# Patient Record
Sex: Female | Born: 1980 | Race: Black or African American | Hispanic: No | Marital: Single | Smoking: Never smoker
Health system: Southern US, Community
[De-identification: ages and names within clinical notes are randomized; demographics above are authoritative.]

## PROBLEM LIST (undated history)

## (undated) DIAGNOSIS — E039 Hypothyroidism, unspecified: Secondary | ICD-10-CM

## (undated) DIAGNOSIS — IMO0002 Reserved for concepts with insufficient information to code with codable children: Secondary | ICD-10-CM

## (undated) DIAGNOSIS — R011 Cardiac murmur, unspecified: Secondary | ICD-10-CM

## (undated) DIAGNOSIS — O139 Gestational [pregnancy-induced] hypertension without significant proteinuria, unspecified trimester: Secondary | ICD-10-CM

## (undated) DIAGNOSIS — J45909 Unspecified asthma, uncomplicated: Secondary | ICD-10-CM

## (undated) DIAGNOSIS — B999 Unspecified infectious disease: Secondary | ICD-10-CM

## (undated) DIAGNOSIS — K219 Gastro-esophageal reflux disease without esophagitis: Secondary | ICD-10-CM

## (undated) DIAGNOSIS — D649 Anemia, unspecified: Secondary | ICD-10-CM

## (undated) HISTORY — DX: Unspecified infectious disease: B99.9

## (undated) HISTORY — DX: Unspecified asthma, uncomplicated: J45.909

## (undated) HISTORY — DX: Gastro-esophageal reflux disease without esophagitis: K21.9

## (undated) HISTORY — DX: Cardiac murmur, unspecified: R01.1

## (undated) HISTORY — DX: Gestational (pregnancy-induced) hypertension without significant proteinuria, unspecified trimester: O13.9

## (undated) HISTORY — DX: Hypothyroidism, unspecified: E03.9

## (undated) HISTORY — DX: Reserved for concepts with insufficient information to code with codable children: IMO0002

## (undated) HISTORY — DX: Anemia, unspecified: D64.9

---
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
## 1998-10-26 DIAGNOSIS — R011 Cardiac murmur, unspecified: Secondary | ICD-10-CM

## 1998-10-26 HISTORY — DX: Cardiac murmur, unspecified: R01.1

## 1999-06-11 ENCOUNTER — Other Ambulatory Visit: Admission: RE | Admit: 1999-06-11 | Discharge: 1999-06-11 | Payer: Self-pay | Admitting: *Deleted

## 2000-01-09 ENCOUNTER — Other Ambulatory Visit: Admission: RE | Admit: 2000-01-09 | Discharge: 2000-01-09 | Payer: Self-pay | Admitting: *Deleted

## 2001-02-24 ENCOUNTER — Other Ambulatory Visit: Admission: RE | Admit: 2001-02-24 | Discharge: 2001-02-24 | Payer: Self-pay | Admitting: *Deleted

## 2001-02-25 ENCOUNTER — Other Ambulatory Visit: Admission: RE | Admit: 2001-02-25 | Discharge: 2001-02-25 | Payer: Self-pay | Admitting: *Deleted

## 2001-02-25 ENCOUNTER — Encounter (INDEPENDENT_AMBULATORY_CARE_PROVIDER_SITE_OTHER): Payer: Self-pay

## 2001-09-07 ENCOUNTER — Other Ambulatory Visit: Admission: RE | Admit: 2001-09-07 | Discharge: 2001-09-07 | Payer: Self-pay | Admitting: *Deleted

## 2002-10-26 DIAGNOSIS — D649 Anemia, unspecified: Secondary | ICD-10-CM

## 2002-10-26 DIAGNOSIS — E039 Hypothyroidism, unspecified: Secondary | ICD-10-CM

## 2002-10-26 DIAGNOSIS — K219 Gastro-esophageal reflux disease without esophagitis: Secondary | ICD-10-CM

## 2002-10-26 HISTORY — DX: Gastro-esophageal reflux disease without esophagitis: K21.9

## 2002-10-26 HISTORY — DX: Hypothyroidism, unspecified: E03.9

## 2002-10-26 HISTORY — DX: Anemia, unspecified: D64.9

## 2004-03-27 ENCOUNTER — Other Ambulatory Visit: Admission: RE | Admit: 2004-03-27 | Discharge: 2004-03-27 | Payer: Self-pay | Admitting: *Deleted

## 2004-09-22 ENCOUNTER — Ambulatory Visit: Payer: Self-pay | Admitting: Internal Medicine

## 2004-09-26 ENCOUNTER — Ambulatory Visit: Payer: Self-pay | Admitting: Internal Medicine

## 2005-03-26 ENCOUNTER — Ambulatory Visit: Payer: Self-pay | Admitting: Internal Medicine

## 2005-03-27 ENCOUNTER — Ambulatory Visit: Payer: Self-pay | Admitting: Internal Medicine

## 2005-06-23 ENCOUNTER — Ambulatory Visit: Payer: Self-pay | Admitting: Internal Medicine

## 2005-06-30 ENCOUNTER — Ambulatory Visit: Payer: Self-pay | Admitting: Internal Medicine

## 2005-09-29 ENCOUNTER — Ambulatory Visit: Payer: Self-pay | Admitting: Internal Medicine

## 2005-10-05 ENCOUNTER — Ambulatory Visit: Payer: Self-pay | Admitting: Internal Medicine

## 2005-11-25 ENCOUNTER — Ambulatory Visit: Payer: Self-pay | Admitting: Internal Medicine

## 2006-03-30 ENCOUNTER — Ambulatory Visit: Payer: Self-pay | Admitting: Internal Medicine

## 2006-07-07 ENCOUNTER — Ambulatory Visit: Payer: Self-pay | Admitting: Internal Medicine

## 2006-07-09 ENCOUNTER — Ambulatory Visit: Payer: Self-pay | Admitting: Internal Medicine

## 2007-01-18 ENCOUNTER — Ambulatory Visit: Payer: Self-pay | Admitting: Internal Medicine

## 2007-01-18 LAB — CONVERTED CEMR LAB
ALT: 23 units/L (ref 0–40)
Alkaline Phosphatase: 58 units/L (ref 39–117)
Basophils Absolute: 0 10*3/uL (ref 0.0–0.1)
CO2: 29 meq/L (ref 19–32)
Calcium: 9.1 mg/dL (ref 8.4–10.5)
Chloride: 100 meq/L (ref 96–112)
Eosinophils Absolute: 0.1 10*3/uL (ref 0.0–0.6)
Glucose, Bld: 156 mg/dL — ABNORMAL HIGH (ref 70–99)
Hgb A1c MFr Bld: 6.1 % — ABNORMAL HIGH (ref 4.6–6.0)
Iron: 41 ug/dL — ABNORMAL LOW (ref 42–145)
Lymphocytes Relative: 23.9 % (ref 12.0–46.0)
MCHC: 33.7 g/dL (ref 30.0–36.0)
MCV: 75.8 fL — ABNORMAL LOW (ref 78.0–100.0)
Neutro Abs: 3.8 10*3/uL (ref 1.4–7.7)
Neutrophils Relative %: 65.5 % (ref 43.0–77.0)
Platelets: 387 10*3/uL (ref 150–400)
Potassium: 4.2 meq/L (ref 3.5–5.1)
RBC: 5.08 M/uL (ref 3.87–5.11)
Sodium: 136 meq/L (ref 135–145)
Transferrin: 230.1 mg/dL (ref >=212.0)

## 2007-01-19 ENCOUNTER — Ambulatory Visit: Payer: Self-pay | Admitting: Pulmonary Disease

## 2007-01-21 ENCOUNTER — Ambulatory Visit: Payer: Self-pay | Admitting: Internal Medicine

## 2007-12-21 ENCOUNTER — Encounter: Payer: Self-pay | Admitting: Internal Medicine

## 2007-12-21 DIAGNOSIS — J45909 Unspecified asthma, uncomplicated: Secondary | ICD-10-CM | POA: Insufficient documentation

## 2007-12-21 DIAGNOSIS — N870 Mild cervical dysplasia: Secondary | ICD-10-CM

## 2007-12-21 DIAGNOSIS — R011 Cardiac murmur, unspecified: Secondary | ICD-10-CM

## 2007-12-21 DIAGNOSIS — J309 Allergic rhinitis, unspecified: Secondary | ICD-10-CM | POA: Insufficient documentation

## 2007-12-22 ENCOUNTER — Ambulatory Visit: Payer: Self-pay | Admitting: Internal Medicine

## 2007-12-22 DIAGNOSIS — S139XXA Sprain of joints and ligaments of unspecified parts of neck, initial encounter: Secondary | ICD-10-CM

## 2007-12-22 DIAGNOSIS — R109 Unspecified abdominal pain: Secondary | ICD-10-CM | POA: Insufficient documentation

## 2007-12-22 DIAGNOSIS — D509 Iron deficiency anemia, unspecified: Secondary | ICD-10-CM | POA: Insufficient documentation

## 2007-12-22 DIAGNOSIS — IMO0002 Reserved for concepts with insufficient information to code with codable children: Secondary | ICD-10-CM

## 2007-12-22 DIAGNOSIS — I10 Essential (primary) hypertension: Secondary | ICD-10-CM | POA: Insufficient documentation

## 2007-12-22 DIAGNOSIS — E739 Lactose intolerance, unspecified: Secondary | ICD-10-CM | POA: Insufficient documentation

## 2007-12-22 DIAGNOSIS — E039 Hypothyroidism, unspecified: Secondary | ICD-10-CM | POA: Insufficient documentation

## 2008-01-18 ENCOUNTER — Telehealth: Payer: Self-pay | Admitting: Internal Medicine

## 2008-01-19 ENCOUNTER — Ambulatory Visit: Payer: Self-pay | Admitting: Internal Medicine

## 2008-01-25 ENCOUNTER — Ambulatory Visit: Payer: Self-pay | Admitting: Internal Medicine

## 2008-01-25 DIAGNOSIS — N3090 Cystitis, unspecified without hematuria: Secondary | ICD-10-CM | POA: Insufficient documentation

## 2008-01-25 DIAGNOSIS — M79609 Pain in unspecified limb: Secondary | ICD-10-CM

## 2008-01-25 LAB — CONVERTED CEMR LAB
ALT: 22 units/L (ref 0–35)
AST: 20 units/L (ref 0–37)
Albumin: 3.6 g/dL (ref 3.5–5.2)
BUN: 9 mg/dL (ref 6–23)
Basophils Absolute: 0.1 10*3/uL (ref 0.0–0.1)
Basophils Relative: 0.9 % (ref 0.0–1.0)
CO2: 27 meq/L (ref 19–32)
Calcium: 9.2 mg/dL (ref 8.4–10.5)
Chloride: 97 meq/L (ref 96–112)
Cholesterol: 181 mg/dL (ref 0–200)
Creatinine, Ser: 0.9 mg/dL (ref 0.4–1.2)
Eosinophils Absolute: 0.1 10*3/uL (ref 0.0–0.6)
Eosinophils Relative: 0.8 % (ref 0.0–5.0)
GFR calc non Af Amer: 79 mL/min
HCT: 37.4 % (ref 36.0–46.0)
Hemoglobin: 12.1 g/dL (ref 12.0–15.0)
LDL Cholesterol: 126 mg/dL — ABNORMAL HIGH (ref 0–99)
MCHC: 32.4 g/dL (ref 30.0–36.0)
MCV: 76.8 fL — ABNORMAL LOW (ref 78.0–100.0)
Monocytes Absolute: 0.8 10*3/uL — ABNORMAL HIGH (ref 0.2–0.7)
Mucus, UA: NEGATIVE
Neutro Abs: 6.8 10*3/uL (ref 1.4–7.7)
Neutrophils Relative %: 66.4 % (ref 43.0–77.0)
Nitrite: NEGATIVE
RBC: 4.87 M/uL (ref 3.87–5.11)
TSH: 4.9 microintl units/mL (ref 0.35–5.50)
Triglycerides: 118 mg/dL (ref 0–149)
Urobilinogen, UA: 0.2 (ref 0.0–1.0)
WBC: 10.4 10*3/uL (ref 4.5–10.5)

## 2008-02-07 ENCOUNTER — Encounter: Payer: Self-pay | Admitting: Internal Medicine

## 2008-07-23 ENCOUNTER — Ambulatory Visit: Payer: Self-pay | Admitting: Internal Medicine

## 2008-07-23 LAB — CONVERTED CEMR LAB
CO2: 30 meq/L (ref 19–32)
Chloride: 105 meq/L (ref 96–112)
Glucose, Bld: 85 mg/dL (ref 70–99)
Lymphocytes Relative: 38.7 % (ref 12.0–46.0)
Monocytes Relative: 8.6 % (ref 3.0–12.0)
Platelets: 410 10*3/uL — ABNORMAL HIGH (ref 150–400)
Potassium: 4.1 meq/L (ref 3.5–5.1)
RDW: 13.4 % (ref 11.5–14.6)
Sodium: 141 meq/L (ref 135–145)
WBC: 7.1 10*3/uL (ref 4.5–10.5)

## 2008-07-30 ENCOUNTER — Ambulatory Visit: Payer: Self-pay | Admitting: Internal Medicine

## 2008-07-30 DIAGNOSIS — K3189 Other diseases of stomach and duodenum: Secondary | ICD-10-CM | POA: Insufficient documentation

## 2008-07-30 DIAGNOSIS — R1013 Epigastric pain: Secondary | ICD-10-CM

## 2009-01-14 ENCOUNTER — Ambulatory Visit: Payer: Self-pay | Admitting: Internal Medicine

## 2009-01-14 LAB — CONVERTED CEMR LAB
AST: 20 units/L (ref 0–37)
Albumin: 3.4 g/dL — ABNORMAL LOW (ref 3.5–5.2)
Alkaline Phosphatase: 60 units/L (ref 39–117)
Basophils Relative: 0.3 % (ref 0.0–3.0)
CO2: 30 meq/L (ref 19–32)
Chloride: 105 meq/L (ref 96–112)
Eosinophils Relative: 2.4 % (ref 0.0–5.0)
GFR calc non Af Amer: 108.21 mL/min (ref >60)
Glucose, Bld: 104 mg/dL — ABNORMAL HIGH (ref 70–99)
Lymphocytes Relative: 29.3 % (ref 12.0–46.0)
MCV: 78.8 fL (ref 78.0–100.0)
Monocytes Absolute: 0.3 10*3/uL (ref 0.1–1.0)
Neutrophils Relative %: 63.4 % (ref 43.0–77.0)
Nitrite: NEGATIVE
Potassium: 4.2 meq/L (ref 3.5–5.1)
RBC: 4.54 M/uL (ref 3.87–5.11)
Sodium: 141 meq/L (ref 135–145)
Total Protein, Urine: NEGATIVE mg/dL
Total Protein: 7.5 g/dL (ref 6.0–8.3)
VLDL: 12.2 mg/dL (ref 0.0–40.0)
WBC: 7.5 10*3/uL (ref 4.5–10.5)
pH: 5.5 (ref 5.0–8.0)

## 2009-01-21 ENCOUNTER — Ambulatory Visit: Payer: Self-pay | Admitting: Internal Medicine

## 2009-10-26 DIAGNOSIS — O139 Gestational [pregnancy-induced] hypertension without significant proteinuria, unspecified trimester: Secondary | ICD-10-CM

## 2009-10-26 HISTORY — DX: Gestational (pregnancy-induced) hypertension without significant proteinuria, unspecified trimester: O13.9

## 2011-03-13 NOTE — Assessment & Plan Note (Signed)
[facility]                             PULMONARY OFFICE NOTE   Dana Stewart, Dana Stewart                      MRN:          [id number]  DATE:01/19/2007                            DOB:          1981/08/05    HISTORY OF PRESENT ILLNESS:  The patient is a 30 year old African-  American female patient of Dr. Posey Rea who has a known history of  hypertension.  He presents today for an acute office visit complaining  of cold-like symptoms over the last five days with productive cough;  however, difficult to expectorate any mucus, fevers or chills.  The  patient complains that cough has progressively worsened; and she has  severe coughing paroxysms, to the point where, she has vomited on two  separate occasions.  The patient denies any hemoptysis, orthopnea, PND,  leg swelling, recent travel, or antibiotic use.  The patient did  complain of some diarrhea initially; however, the is now resolved.   PAST MEDICAL HISTORY:  Reveals hypertension.   CURRENT MEDICATIONS:  Correct and reviewed on the front of the chart.   PHYSICAL EXAMINATION:  The patient is a morbidly obese female in no  acute distress.  Temperature 99.5 blood pressure 126/80, O2 saturation is 98% on room  air.  HEENT:  Nasal mucosa is slightly pale.  Nontender sinuses.  Posterior  pharynx is clear.  TMS normal. EACs are clear.  NECK:  Supple without cervical adenopathy.  No JVD.  LUNGS:  Sounds are clear to auscultation bilaterally without any  wheezing or crackles.  CARDIAC:  S1-S2 without murmur, rub, or gallop.  ABDOMEN:  Soft without any hepatosplenomegaly.  No guarding or rebound  noted.  Bowel sounds are positive and all four quadrants.  EXTREMITIES:  Warm without any calf tenderness, clubbing, cyanosis or  edema.  SKIN:  Warm without rash.   IMPRESSION AND PLAN:  Acute upper respiratory infection with possible  early tracheobronchitis.  The patient is to begin Omnicef x5 days, add  in  Mucinex DM twice daily, and may use Phenergan VC with codeine 8  ounces 1-2 teaspoons q.4-6 h. as needed for cough.  The patient is aware of same.  The patient is returned back to Dr.  Posey Rea, as scheduled, or sooner if needed.     Rubye Oaks, NP  Electronically Signed      Lonzo Cloud. Kriste Basque, MD  Electronically Signed   TP/MedQ  DD: 01/20/2007  DT: 01/20/2007  Job #: [id number]

## 2012-12-26 ENCOUNTER — Ambulatory Visit: Payer: Private Health Insurance - Indemnity | Admitting: Obstetrics and Gynecology

## 2012-12-26 DIAGNOSIS — E071 Dyshormogenetic goiter: Secondary | ICD-10-CM

## 2012-12-26 DIAGNOSIS — Z331 Pregnant state, incidental: Secondary | ICD-10-CM

## 2012-12-26 LAB — POCT URINALYSIS DIPSTICK
Bilirubin, UA: NEGATIVE
Blood, UA: NEGATIVE
Nitrite, UA: NEGATIVE
Spec Grav, UA: 1.015
Urobilinogen, UA: NEGATIVE
pH, UA: 7

## 2012-12-26 LAB — TSH: TSH: 1.483 u[IU]/mL (ref 0.350–4.500)

## 2012-12-26 NOTE — Progress Notes (Signed)
NOB interview. ROI signed for C/S results from [facility]. NOB w/u with CA 01/12/13.

## 2012-12-27 LAB — PRENATAL PANEL VII
Basophils Absolute: 0 10*3/uL (ref 0.0–0.1)
HCT: 34.1 % — ABNORMAL LOW (ref 36.0–46.0)
HIV: NONREACTIVE
Lymphocytes Relative: 22 % (ref 12–46)
Neutro Abs: 4.7 10*3/uL (ref 1.7–7.7)
Neutrophils Relative %: 70 % (ref 43–77)
Platelets: 309 10*3/uL (ref 150–400)
RDW: 15.8 % — ABNORMAL HIGH (ref 11.5–15.5)
WBC: 6.6 10*3/uL (ref 4.0–10.5)

## 2012-12-27 LAB — CULTURE, OB URINE: Colony Count: 9000

## 2012-12-28 LAB — HEMOGLOBINOPATHY EVALUATION
Hgb F Quant: 0 % (ref 0.0–2.0)
Hgb S Quant: 39.7 % — ABNORMAL HIGH

## 2012-12-29 ENCOUNTER — Encounter: Payer: Self-pay | Admitting: Obstetrics and Gynecology

## 2012-12-29 DIAGNOSIS — O09899 Supervision of other high risk pregnancies, unspecified trimester: Secondary | ICD-10-CM | POA: Insufficient documentation

## 2012-12-29 DIAGNOSIS — D573 Sickle-cell trait: Secondary | ICD-10-CM | POA: Insufficient documentation

## 2012-12-29 DIAGNOSIS — O34219 Maternal care for unspecified type scar from previous cesarean delivery: Secondary | ICD-10-CM | POA: Insufficient documentation

## 2013-07-11 ENCOUNTER — Ambulatory Visit (INDEPENDENT_AMBULATORY_CARE_PROVIDER_SITE_OTHER): Payer: Managed Care, Other (non HMO) | Admitting: General Surgery

## 2013-07-11 ENCOUNTER — Encounter (INDEPENDENT_AMBULATORY_CARE_PROVIDER_SITE_OTHER): Payer: Self-pay | Admitting: General Surgery

## 2013-07-11 VITALS — BP 132/70 | HR 111 | Ht 62.0 in | Wt 237.2 lb

## 2013-07-11 DIAGNOSIS — K649 Unspecified hemorrhoids: Secondary | ICD-10-CM

## 2013-07-11 MED ORDER — LIDOCAINE HCL 2 % EX GEL: CUTANEOUS | Status: DC

## 2013-07-11 NOTE — Progress Notes (Signed)
Subjective:     Patient ID: Dana Stewart, female   DOB: [birth year], 32 y.o.   MRN: [id number]  HPI The patient is a 32 year old female who is [redacted] weeks pregnant. She is referred by Dr. Stefano Gaul for a flareup of hemorrhoids. The patient states that the hemorrhoids flared up approximately 4 days ago. She states that this is the first flareup of her pregnancy with hemorrhoids. The patient has been started on hemorrhoid cream which she began yesterday. She does state that Tylenol helps the pain and uncomfortable sensation. The patient states there is no blood in the toilet when she wipes.  Review of Systems  Constitutional: Negative.   HENT: Negative.   Respiratory: Negative.   Gastrointestinal: Negative.   Neurological: Negative.   All other systems reviewed and are negative.       Objective:   Physical Exam  Constitutional: She is oriented to person, place, and time. She appears well-developed and well-nourished.  HENT:  Head: Normocephalic and atraumatic.  Eyes: Conjunctivae and EOM are normal. Pupils are equal, round, and reactive to light.  Neck: Normal range of motion. Neck supple.  Cardiovascular: Normal rate, regular rhythm and normal heart sounds.   Pulmonary/Chest: Effort normal and breath sounds normal.  Abdominal: Bowel sounds are normal.  Genitourinary:     Musculoskeletal: Normal range of motion.  Neurological: She is alert and oriented to person, place, and time.  Skin: Skin is warm and dry.       Assessment:     The patient is a 32 year old female with symptomatic, nonthrombosed hemorrhoids.     Plan:     1. I feel a symptomatic treatment is appropriate at this time. I do not feel like they are thrombosed and did not need excision.  2. Patient can continue with Tylenol as needed for pain as well as her steroid cream. I will prescribe her lidocaine jelly to help well having bowel movements. 3.  Should the steroid cream tnot be sufficient for treating her  hemorrhoids, she can call back to for a prescription for steroid suppositories.

## 2013-08-08 ENCOUNTER — Inpatient Hospital Stay (HOSPITAL_COMMUNITY): Payer: Managed Care, Other (non HMO) | Admitting: Anesthesiology

## 2013-08-08 ENCOUNTER — Encounter (HOSPITAL_COMMUNITY): Payer: Self-pay | Admitting: *Deleted

## 2013-08-08 ENCOUNTER — Inpatient Hospital Stay (HOSPITAL_COMMUNITY)
Admission: AD | Admit: 2013-08-08 | Discharge: 2013-08-12 | DRG: 765 | Disposition: A | Discharge status: Home / Self Care | Payer: Managed Care, Other (non HMO) | Source: Ambulatory Visit | Attending: Obstetrics and Gynecology | Admitting: Obstetrics and Gynecology

## 2013-08-08 ENCOUNTER — Encounter (HOSPITAL_COMMUNITY): Payer: Managed Care, Other (non HMO) | Admitting: Anesthesiology

## 2013-08-08 ENCOUNTER — Encounter (HOSPITAL_COMMUNITY)
Admission: AD | Disposition: A | Discharge status: Home / Self Care | Payer: Self-pay | Source: Ambulatory Visit | Attending: Obstetrics and Gynecology

## 2013-08-08 DIAGNOSIS — IMO0002 Reserved for concepts with insufficient information to code with codable children: Secondary | ICD-10-CM | POA: Diagnosis present

## 2013-08-08 DIAGNOSIS — E079 Disorder of thyroid, unspecified: Secondary | ICD-10-CM | POA: Diagnosis present

## 2013-08-08 DIAGNOSIS — Y9210 Unspecified residential institution as the place of occurrence of the external cause: Secondary | ICD-10-CM | POA: Diagnosis not present

## 2013-08-08 DIAGNOSIS — Z98891 History of uterine scar from previous surgery: Secondary | ICD-10-CM

## 2013-08-08 DIAGNOSIS — T81500A Unspecified complication of foreign body accidentally left in body following surgical operation, initial encounter: Secondary | ICD-10-CM | POA: Diagnosis not present

## 2013-08-08 DIAGNOSIS — O34219 Maternal care for unspecified type scar from previous cesarean delivery: Principal | ICD-10-CM | POA: Diagnosis present

## 2013-08-08 DIAGNOSIS — O09893 Supervision of other high risk pregnancies, third trimester: Secondary | ICD-10-CM

## 2013-08-08 DIAGNOSIS — S2095XA Superficial foreign body of unspecified parts of thorax, initial encounter: Secondary | ICD-10-CM | POA: Diagnosis not present

## 2013-08-08 DIAGNOSIS — E039 Hypothyroidism, unspecified: Secondary | ICD-10-CM | POA: Diagnosis present

## 2013-08-08 DIAGNOSIS — D573 Sickle-cell trait: Secondary | ICD-10-CM | POA: Diagnosis present

## 2013-08-08 DIAGNOSIS — O9902 Anemia complicating childbirth: Secondary | ICD-10-CM | POA: Diagnosis present

## 2013-08-08 LAB — COMPREHENSIVE METABOLIC PANEL
ALT: 10 U/L (ref 0–35)
Alkaline Phosphatase: 162 U/L — ABNORMAL HIGH (ref 39–117)
CO2: 23 mEq/L (ref 19–32)
Chloride: 103 mEq/L (ref 96–112)
GFR calc Af Amer: >90 mL/min (ref >90)
GFR calc non Af Amer: >90 mL/min (ref >90)
Glucose, Bld: 73 mg/dL (ref 70–99)
Potassium: 3.9 mEq/L (ref 3.5–5.1)
Sodium: 137 mEq/L (ref 135–145)
Total Bilirubin: 0.5 mg/dL (ref 0.3–1.2)

## 2013-08-08 LAB — CBC
HCT: 29.4 % — ABNORMAL LOW (ref 36.0–46.0)
MCHC: 32.3 g/dL (ref 30.0–36.0)
RDW: 17.7 % — ABNORMAL HIGH (ref 11.5–15.5)

## 2013-08-08 LAB — PROTEIN / CREATININE RATIO, URINE: Protein Creatinine Ratio: 0.37 — ABNORMAL HIGH (ref 0.00–0.15)

## 2013-08-08 LAB — URIC ACID: Uric Acid, Serum: 5.2 mg/dL (ref 2.4–7.0)

## 2013-08-08 LAB — LACTATE DEHYDROGENASE: LDH: 198 U/L (ref 94–250)

## 2013-08-08 SURGERY — Surgical Case
Anesthesia: Spinal | Site: Abdomen | Wound class: Clean Contaminated

## 2013-08-08 MED ORDER — CEFAZOLIN SODIUM-DEXTROSE 2-3 GM-% IV SOLR
2.0000 g | Freq: Once | INTRAVENOUS | Status: AC
  Administered 2013-08-09: 2 g via INTRAVENOUS

## 2013-08-08 MED ORDER — CITRIC ACID-SODIUM CITRATE 334-500 MG/5ML PO SOLN
30.0000 mL | Freq: Once | ORAL | Status: AC
  Administered 2013-08-08: 30 mL via ORAL

## 2013-08-08 MED ORDER — LACTATED RINGERS IV SOLN
INTRAVENOUS | Status: DC
  Administered 2013-08-08: 125 mL/h via INTRAVENOUS

## 2013-08-08 MED ORDER — FAMOTIDINE IN NACL 20-0.9 MG/50ML-% IV SOLN
20.0000 mg | Freq: Once | INTRAVENOUS | Status: AC
  Administered 2013-08-08: 20 mg via INTRAVENOUS
  Filled 2013-08-08: qty 50

## 2013-08-08 MED ORDER — LABETALOL HCL 5 MG/ML IV SOLN
10.0000 mg | INTRAVENOUS | Status: DC | PRN
  Administered 2013-08-08: 10 mg via INTRAVENOUS
  Filled 2013-08-08: qty 4

## 2013-08-08 MED ORDER — CITRIC ACID-SODIUM CITRATE 334-500 MG/5ML PO SOLN
ORAL | Status: AC
  Administered 2013-08-08: 30 mL via ORAL
  Filled 2013-08-08: qty 15

## 2013-08-08 SURGICAL SUPPLY — 36 items
APL SKNCLS STERI-STRIP NONHPOA (GAUZE/BANDAGES/DRESSINGS) ×1
BENZOIN TINCTURE PRP APPL 2/3 (GAUZE/BANDAGES/DRESSINGS) ×2 IMPLANT
CLAMP CORD UMBIL (MISCELLANEOUS) ×1 IMPLANT
CLOTH BEACON ORANGE TIMEOUT ST (SAFETY) ×2 IMPLANT
CONTAINER PREFILL 10% NBF 15ML (MISCELLANEOUS) IMPLANT
DRAPE LG THREE QUARTER DISP (DRAPES) ×4 IMPLANT
DRSG OPSITE POSTOP 4X10 (GAUZE/BANDAGES/DRESSINGS) ×2 IMPLANT
DURAPREP 26ML APPLICATOR (WOUND CARE) ×2 IMPLANT
ELECT REM PT RETURN 9FT ADLT (ELECTROSURGICAL) ×2
ELECTRODE REM PT RTRN 9FT ADLT (ELECTROSURGICAL) ×1 IMPLANT
EXTRACTOR VACUUM M CUP 4 TUBE (SUCTIONS) IMPLANT
GLOVE BIO SURGEON STRL SZ7.5 (GLOVE) ×2 IMPLANT
GLOVE BIOGEL PI IND STRL 7.5 (GLOVE) ×1 IMPLANT
GLOVE BIOGEL PI INDICATOR 7.5 (GLOVE) ×1
GOWN PREVENTION PLUS XLARGE (GOWN DISPOSABLE) ×2 IMPLANT
GOWN STRL REIN XL XLG (GOWN DISPOSABLE) ×2 IMPLANT
KIT ABG SYR 3ML LUER SLIP (SYRINGE) IMPLANT
NDL HYPO 25X5/8 SAFETYGLIDE (NEEDLE) IMPLANT
NEEDLE HYPO 25X5/8 SAFETYGLIDE (NEEDLE) IMPLANT
NS IRRIG 1000ML POUR BTL (IV SOLUTION) ×2 IMPLANT
PACK C SECTION WH (CUSTOM PROCEDURE TRAY) ×2 IMPLANT
PAD OB MATERNITY 4.3X12.25 (PERSONAL CARE ITEMS) ×2 IMPLANT
RETRACTOR WND ALEXIS 25 LRG (MISCELLANEOUS) ×1 IMPLANT
RTRCTR WOUND ALEXIS 25CM LRG (MISCELLANEOUS) ×2
SLEEVE SCD COMPRESS KNEE MED (MISCELLANEOUS) ×1 IMPLANT
STRIP CLOSURE SKIN 1/2X4 (GAUZE/BANDAGES/DRESSINGS) ×2 IMPLANT
SUT CHROMIC 2 0 CT 1 (SUTURE) ×2 IMPLANT
SUT MNCRL AB 3-0 PS2 27 (SUTURE) ×2 IMPLANT
SUT PLAIN 0 NONE (SUTURE) IMPLANT
SUT PLAIN 2 0 XLH (SUTURE) ×2 IMPLANT
SUT VIC AB 0 CT1 36 (SUTURE) ×2 IMPLANT
SUT VIC AB 0 CTX 36 (SUTURE) ×4
SUT VIC AB 0 CTX36XBRD ANBCTRL (SUTURE) ×3 IMPLANT
TOWEL OR 17X24 6PK STRL BLUE (TOWEL DISPOSABLE) ×2 IMPLANT
TRAY FOLEY CATH 14FR (SET/KITS/TRAYS/PACK) ×2 IMPLANT
WATER STERILE IRR 1000ML POUR (IV SOLUTION) ×1 IMPLANT

## 2013-08-08 NOTE — MAU Note (Signed)
166/92 in office today, +protein in urine.  Hx of same with last preg.  Sent over for blood work, ? Induction.

## 2013-08-08 NOTE — Anesthesia Preprocedure Evaluation (Signed)
Anesthesia Evaluation  Patient identified by MRN, date of birth, ID band Patient awake    Reviewed: Allergy & Precautions, H&P , NPO status , Patient's Chart, lab work & pertinent test results  Airway Mallampati: II TM Distance: >3 FB Neck ROM: full    Dental no notable dental hx.    Pulmonary    Pulmonary exam normal       Cardiovascular hypertension,     Neuro/Psych negative neurological ROS  negative psych ROS   GI/Hepatic Neg liver ROS,   Endo/Other  Hypothyroidism Morbid obesity  Renal/GU negative Renal ROS     Musculoskeletal   Abdominal (+) + obese,   Peds  Hematology negative hematology ROS (+)   Anesthesia Other Findings   Reproductive/Obstetrics (+) Pregnancy                           Anesthesia Physical Anesthesia Plan  ASA: III and emergent  Anesthesia Plan: Spinal   Post-op Pain Management:    Induction:   Airway Management Planned:   Additional Equipment:   Intra-op Plan:   Post-operative Plan:   Informed Consent: I have reviewed the patients History and Physical, chart, labs and discussed the procedure including the risks, benefits and alternatives for the proposed anesthesia with the patient or authorized representative who has indicated his/her understanding and acceptance.     Plan Discussed with: CRNA and Surgeon  Anesthesia Plan Comments:         Anesthesia Quick Evaluation

## 2013-08-08 NOTE — Progress Notes (Signed)
Resting comfortably--IV just started, difficult stick. Filed Vitals:   08/08/13 2029 08/08/13 2217 08/08/13 2314 08/08/13 2322  BP: 161/94 148/80 124/90   Pulse: 102 98 117   Temp:      TempSrc:      Resp:      Height:    5\' 2"  (1.575 m)  Weight:    241 lb (109.317 kg)   Protein/creatnine ratio = 0.37, c/w pre-eclampsia.  Consulted with Dr. Su Hilt. Plan C/S tonight. R&B reviewed with patient and husband, including bleeding, infection, damage to other organs, anesthesia. Patient and husband seem to understand these risks and wish to proceed.  Nigel Bridgeman, CNM 08/08/13 11:30p

## 2013-08-08 NOTE — H&P (Signed)
Dana Stewart is a 32 y.o. female, G3P1011 at 65 5/7 weeks, presented from the office for BP evaluation--BP was 160s/80-95, with 1+ proteinuria on a voided specimen.  Denies HA, visual sx, epigastric pain.  Mild pedal edema.  Hx previous C/S 2011, with induction at 40 weeks due to elevated BP, with primary C/S for FTP at 6 cm.  Had desired VBAC, with consent signed--now considering C/S if decision is between induction and C/S due to elevated BP.  Last ate at 2pm today.  History of present pregnancy: Patient entered care at 10 weeks, with BP 130/80 at that visit.   EDC of 08/10/13 was established by LMP and was in agreement with [place] at 11 6/7 weeks   Anatomy scan:  19 4/7 weeks, with normal findings and an posterior placenta.   Additional [place] evaluations:   38 6/7 weeks--EFW 7+12, 79%ile, vtx, AFI 50%ile.   Significant prenatal events:  Referred to general surgeon at 36 weeks for hemorrhoid evaluation--recommended observation, meds Rx'd.   Carpal tunnel sx in 3rd trimester.  Declined amnio (FOB Hgb AC, patient Hgb AS). Last evaluation:  08/08/13, with elevated BP and proteinuria.  Last VE 10/8, cervix closed, 50%, vtx, -3.  Patient Active Problem List   Diagnosis Date Noted  . Sickle cell trait 12/29/2012  . Previous cesarean delivery, antepartum condition or complication 12/29/2012  . Prior pregnancy complicated by [facility], antepartum 12/29/2012  . DYSPEPSIA&OTHER SPEC DISORDERS FUNCTION STOMACH 07/30/2008  . CYSTITIS 01/25/2008  . HYPOTHYROIDISM 12/22/2007  . GLUCOSE INTOLERANCE 12/22/2007  . ANEMIA-IRON DEFICIENCY 12/22/2007  . HYPERTENSION 12/22/2007  . ABDOMINAL PAIN, UNSPECIFIED SITE 12/22/2007  . ALLERGIC RHINITIS 12/21/2007  . ASTHMA 12/21/2007  . MILD DYSPLASIA OF CERVIX 12/21/2007  . UNDIAGNOSED CARDIAC MURMURS 12/21/2007    OB History   Grav Para Term Preterm Abortions TAB SAB Ect Mult Living   3 1 1  1     1     2005--TAB 7 weeks 2011--Primary C/S, [facility], induced  for [facility], progressed to 6 cm, 40 1/7 weeks, 7+3  Past Medical History  Diagnosis Date  . Heart murmur 2000    NO EVAL; PROPHYLACTIC MEDS  . Hypothyroidism 2004    DR MARGIE TRENT; [facility]  . GERD (gastroesophageal reflux disease) 2004    NO MEDS  . Pregnancy induced hypertension 2011  . Abnormal Pap smear 1999    COLPO LAST PAP 01/2012  . Infection     OCC UTI  . Infection     OCC YEAST  . Anemia 2004    TAKING FE  . Asthma CHILDHOOD   Past Surgical History  Procedure Laterality Date  . Cesarean section  03/27/10   Family History: family history includes Diabetes in her father; Glaucoma in her maternal grandmother; Hyperlipidemia in her father; Hypertension in her father; Kidney disease in her maternal aunt; Other in her maternal grandfather; Parkinson's disease in her maternal grandfather.  Social History:  reports that she has never smoked. She has never used smokeless tobacco. She reports that she does not drink alcohol or use illicit drugs. FOB Edmonia James Thurmond Butts) is present with her and is involved and supportive.  Patient is employed with [employer].   Prenatal Transfer Tool  Maternal Diabetes: No Genetic Screening: Declined Maternal Ultrasounds/Referrals: Normal Fetal Ultrasounds or other Referrals:  None Maternal Substance Abuse:  No Significant Maternal Medications:  Meds include: Syntroid--75 mcg Significant Maternal Lab Results: Lab values include: Group B Strep negative    ROS:  Mild  contractions, +FM.  No Known Allergies   Dilation: Closed Effacement (%): Thick Exam by:: Manfred Arch CNM Blood pressure 161/94, pulse 102, temperature 99.2 F (37.3 C), temperature source Oral, resp. rate 20, weight 241 lb (109.317 kg), last menstrual period 11/03/2012.  Chest clear Heart RRR without murmur Abd gravid, NT, FH 39 cm Pelvic: cervix closed, long, vtx ballotable, -3 Ext: DTR 2+ without clonus, 1+ edema  FHR: Baseline 150-160, one mild variable, negative  spontaneous CST. UCs:  q 2-5 min, mild.  Prenatal labs: ABO, Rh: A/POS/-- (03/03 0927) Antibody: NEG (03/03 0927) Rubella:   Immune RPR: NON REAC (03/03 0927)  HBsAg: NEGATIVE (03/03 0927)  HIV: NON REACTIVE (03/03 0927)  GBS: Negative (09/18 0000) Sickle cell/Hgb electrophoresis:  Hgb AS (FOB Hgb AC) Pap:  WNL 01/2013 GC:  Deferred Chlamydia:  Deferred Genetic screenings:  Declined Glucola:  WNL TSH WNL during pregnancy   Results for orders placed during the hospital encounter of 08/08/13 (from the past 24 hour(s))  COMPREHENSIVE METABOLIC PANEL     Status: Abnormal   Collection Time    08/08/13  7:26 PM      Result Value Range   Sodium 137  135 - 145 mEq/L   Potassium 3.9  3.5 - 5.1 mEq/L   Chloride 103  96 - 112 mEq/L   CO2 23  19 - 32 mEq/L   Glucose, Bld 73  70 - 99 mg/dL   BUN 6  6 - 23 mg/dL   Creatinine, Ser 4.54  0.50 - 1.10 mg/dL   Calcium 9.3  8.4 - 09.8 mg/dL   Total Protein 6.5  6.0 - 8.3 g/dL   Albumin 2.6 (*) 3.5 - 5.2 g/dL   AST 18  0 - 37 U/L   ALT 10  0 - 35 U/L   Alkaline Phosphatase 162 (*) 39 - 117 U/L   Total Bilirubin 0.5  0.3 - 1.2 mg/dL   GFR calc non Af Amer >90  >90 mL/min   GFR calc Af Amer >90  >90 mL/min  CBC     Status: Abnormal   Collection Time    08/08/13  7:26 PM      Result Value Range   WBC 7.1  4.0 - 10.5 K/uL   RBC 4.50  3.87 - 5.11 MIL/uL   Hemoglobin 9.5 (*) 12.0 - 15.0 g/dL   HCT 11.9 (*) 14.7 - 82.9 %   MCV 65.3 (*) 78.0 - 100.0 fL   MCH 21.1 (*) 26.0 - 34.0 pg   MCHC 32.3  30.0 - 36.0 g/dL   RDW 56.2 (*) 13.0 - 86.5 %   Platelets 280  150 - 400 K/uL  LACTATE DEHYDROGENASE     Status: None   Collection Time    08/08/13  7:26 PM      Result Value Range   LDH 198  94 - 250 U/L  URIC ACID     Status: None   Collection Time    08/08/13  7:26 PM      Result Value Range   Uric Acid, Serum 5.2  2.4 - 7.0 mg/dL     Assessment/Plan: IUP at 39 5/7 weeks Elevated BP--gestational hypertension vs pre-eclampsia Previous  C/S Unfavorable cervix GBS negative Hypothyroidism  Plan: Consulted with Dr. Su Hilt. Protein/creatnine ratio pending. Will hold in MAU until results received. Labetalol 10 mg IV now, with usual regimen of 20 mg in 15 min, etc., planned if unresponsive to initial dose. Reviewed status with patient--patient  now leaning toward repeat C/S, in light of unfavorable cervix.   LATHAM, VICKICNM, MN 08/08/2013, 9:55 PM  Urine PCR elevated.  Pt with preeclampsia and prefers at this point to proceed with repeat c/s rather than induction and TOL.  The risks, benefits and alternatives were discussed with the patient, patient verbalized understanding and consent signed and witnessed.  Pt informed she will go to AICU after delivery to receive MgSO4 for 24hrs.  She is agreeable.  Questions answered.

## 2013-08-08 NOTE — MAU Note (Signed)
Denies HA, visual changes, epigastric pain. Reports increase in swelling in hands and feet.

## 2013-08-09 ENCOUNTER — Inpatient Hospital Stay (HOSPITAL_COMMUNITY): Payer: Managed Care, Other (non HMO) | Admitting: Anesthesiology

## 2013-08-09 ENCOUNTER — Inpatient Hospital Stay (HOSPITAL_COMMUNITY): Payer: Managed Care, Other (non HMO)

## 2013-08-09 ENCOUNTER — Encounter (HOSPITAL_COMMUNITY): Payer: Managed Care, Other (non HMO) | Admitting: Anesthesiology

## 2013-08-09 ENCOUNTER — Encounter (HOSPITAL_COMMUNITY): Payer: Self-pay | Admitting: *Deleted

## 2013-08-09 ENCOUNTER — Encounter (HOSPITAL_COMMUNITY)
Admission: AD | Disposition: A | Discharge status: Home / Self Care | Payer: Self-pay | Source: Ambulatory Visit | Attending: Obstetrics and Gynecology

## 2013-08-09 HISTORY — PX: LUMBAR WOUND DEBRIDEMENT: SHX1988

## 2013-08-09 LAB — MAGNESIUM: Magnesium: 3.6 mg/dL — ABNORMAL HIGH (ref 1.5–2.5)

## 2013-08-09 LAB — COMPREHENSIVE METABOLIC PANEL
Albumin: 2.4 g/dL — ABNORMAL LOW (ref 3.5–5.2)
BUN: 5 mg/dL — ABNORMAL LOW (ref 6–23)
CO2: 22 mEq/L (ref 19–32)
Chloride: 105 mEq/L (ref 96–112)
Creatinine, Ser: 0.71 mg/dL (ref 0.50–1.10)
GFR calc non Af Amer: >90 mL/min (ref >90)
Total Bilirubin: 0.5 mg/dL (ref 0.3–1.2)

## 2013-08-09 LAB — URIC ACID: Uric Acid, Serum: 5.7 mg/dL (ref 2.4–7.0)

## 2013-08-09 LAB — CBC
HCT: 27.8 % — ABNORMAL LOW (ref 36.0–46.0)
Hemoglobin: 9.2 g/dL — ABNORMAL LOW (ref 12.0–15.0)
MCH: 21.6 pg — ABNORMAL LOW (ref 26.0–34.0)
MCHC: 33.1 g/dL (ref 30.0–36.0)
MCV: 65.3 fL — ABNORMAL LOW (ref 78.0–100.0)
RDW: 17.8 % — ABNORMAL HIGH (ref 11.5–15.5)
WBC: 9.7 10*3/uL (ref 4.0–10.5)

## 2013-08-09 LAB — TYPE AND SCREEN: Antibody Screen: NEGATIVE

## 2013-08-09 LAB — LACTATE DEHYDROGENASE: LDH: 219 U/L (ref 94–250)

## 2013-08-09 LAB — RPR: RPR Ser Ql: NONREACTIVE

## 2013-08-09 SURGERY — LUMBAR WOUND DEBRIDEMENT
Anesthesia: General

## 2013-08-09 MED ORDER — HYDROMORPHONE HCL PF 1 MG/ML IJ SOLN
INTRAMUSCULAR | Status: AC
  Filled 2013-08-09: qty 1

## 2013-08-09 MED ORDER — OXYCODONE-ACETAMINOPHEN 5-325 MG PO TABS
1.0000 | ORAL_TABLET | ORAL | Status: DC | PRN
Start: 2013-08-09 — End: 2013-08-12
  Administered 2013-08-09 – 2013-08-12 (×8): 1 via ORAL
  Filled 2013-08-09 (×8): qty 1

## 2013-08-09 MED ORDER — NEOSTIGMINE METHYLSULFATE 1 MG/ML IJ SOLN
INTRAMUSCULAR | Status: DC | PRN
  Administered 2013-08-09: 2 mg via INTRAVENOUS

## 2013-08-09 MED ORDER — KETOROLAC TROMETHAMINE 30 MG/ML IJ SOLN: 30.0000 mg | Freq: Four times a day (QID) | INTRAMUSCULAR | Status: AC | PRN

## 2013-08-09 MED ORDER — 0.9 % SODIUM CHLORIDE (POUR BTL) OPTIME
TOPICAL | Status: DC | PRN
  Administered 2013-08-09: 1000 mL

## 2013-08-09 MED ORDER — MIDAZOLAM HCL 5 MG/5ML IJ SOLN
INTRAMUSCULAR | Status: DC | PRN
  Administered 2013-08-09: 1 mg via INTRAVENOUS

## 2013-08-09 MED ORDER — KETOROLAC TROMETHAMINE 60 MG/2ML IM SOLN
60.0000 mg | Freq: Once | INTRAMUSCULAR | Status: AC | PRN
  Administered 2013-08-09: 60 mg via INTRAMUSCULAR

## 2013-08-09 MED ORDER — ONDANSETRON HCL 4 MG/2ML IJ SOLN
INTRAMUSCULAR | Status: DC | PRN
  Administered 2013-08-09: 4 mg via INTRAMUSCULAR

## 2013-08-09 MED ORDER — MEPERIDINE HCL 25 MG/ML IJ SOLN: 6.2500 mg | INTRAMUSCULAR | Status: DC | PRN

## 2013-08-09 MED ORDER — WITCH HAZEL-GLYCERIN EX PADS: 1.0000 "application " | MEDICATED_PAD | CUTANEOUS | Status: DC | PRN

## 2013-08-09 MED ORDER — DIPHENHYDRAMINE HCL 25 MG PO CAPS: 25.0000 mg | ORAL_CAPSULE | Freq: Four times a day (QID) | ORAL | Status: DC | PRN

## 2013-08-09 MED ORDER — ONDANSETRON HCL 4 MG/2ML IJ SOLN: 4.0000 mg | Freq: Once | INTRAMUSCULAR | Status: DC | PRN

## 2013-08-09 MED ORDER — ARTIFICIAL TEARS OP OINT
TOPICAL_OINTMENT | OPHTHALMIC | Status: DC | PRN
  Administered 2013-08-09: 1 via OPHTHALMIC

## 2013-08-09 MED ORDER — MORPHINE SULFATE (PF) 0.5 MG/ML IJ SOLN: INTRAMUSCULAR | Status: DC | PRN

## 2013-08-09 MED ORDER — PRENATAL MULTIVITAMIN CH
1.0000 | ORAL_TABLET | Freq: Every day | ORAL | Status: DC
  Administered 2013-08-10 – 2013-08-12 (×3): 1 via ORAL
  Filled 2013-08-09 (×3): qty 1

## 2013-08-09 MED ORDER — MEPERIDINE HCL 25 MG/ML IJ SOLN
6.2500 mg | INTRAMUSCULAR | Status: DC | PRN
  Administered 2013-08-09: 6.25 mg via INTRAVENOUS

## 2013-08-09 MED ORDER — MORPHINE SULFATE (PF) 0.5 MG/ML IJ SOLN
INTRAMUSCULAR | Status: DC | PRN
  Administered 2013-08-09: 200 ug via INTRATHECAL

## 2013-08-09 MED ORDER — DIBUCAINE 1 % RE OINT: 1.0000 "application " | TOPICAL_OINTMENT | RECTAL | Status: DC | PRN

## 2013-08-09 MED ORDER — MAGNESIUM SULFATE 40 G IN LACTATED RINGERS - SIMPLE
2.0000 g/h | INTRAVENOUS | Status: DC
  Administered 2013-08-09 (×2): 2 g/h via INTRAVENOUS
  Filled 2013-08-09 (×2): qty 500

## 2013-08-09 MED ORDER — DIPHENHYDRAMINE HCL 25 MG PO CAPS
25.0000 mg | ORAL_CAPSULE | ORAL | Status: DC | PRN
  Filled 2013-08-09: qty 1

## 2013-08-09 MED ORDER — ONDANSETRON HCL 4 MG PO TABS: 4.0000 mg | ORAL_TABLET | ORAL | Status: DC | PRN

## 2013-08-09 MED ORDER — SUCCINYLCHOLINE CHLORIDE 20 MG/ML IJ SOLN
INTRAMUSCULAR | Status: DC | PRN
  Administered 2013-08-09: 100 mg via INTRAVENOUS

## 2013-08-09 MED ORDER — NALOXONE HCL 1 MG/ML IJ SOLN
1.0000 ug/kg/h | INTRAMUSCULAR | Status: DC | PRN
  Filled 2013-08-09: qty 2

## 2013-08-09 MED ORDER — KETOROLAC TROMETHAMINE 60 MG/2ML IM SOLN
INTRAMUSCULAR | Status: AC
  Filled 2013-08-09: qty 2

## 2013-08-09 MED ORDER — METOCLOPRAMIDE HCL 5 MG/ML IJ SOLN: 10.0000 mg | Freq: Three times a day (TID) | INTRAMUSCULAR | Status: DC | PRN

## 2013-08-09 MED ORDER — FENTANYL CITRATE 0.05 MG/ML IJ SOLN
INTRAMUSCULAR | Status: DC | PRN
  Administered 2013-08-09: 25 ug via INTRATHECAL

## 2013-08-09 MED ORDER — PROMETHAZINE HCL 25 MG/ML IJ SOLN: 6.2500 mg | INTRAMUSCULAR | Status: DC | PRN

## 2013-08-09 MED ORDER — KETOROLAC TROMETHAMINE 30 MG/ML IJ SOLN: 15.0000 mg | Freq: Once | INTRAMUSCULAR | Status: DC | PRN

## 2013-08-09 MED ORDER — ROCURONIUM BROMIDE 100 MG/10ML IV SOLN
INTRAVENOUS | Status: DC | PRN
  Administered 2013-08-09: 15 mg via INTRAVENOUS

## 2013-08-09 MED ORDER — DIPHENHYDRAMINE HCL 50 MG/ML IJ SOLN: 25.0000 mg | INTRAMUSCULAR | Status: DC | PRN

## 2013-08-09 MED ORDER — MAGNESIUM SULFATE BOLUS VIA INFUSION
4.0000 g | Freq: Once | INTRAVENOUS | Status: AC
  Administered 2013-08-09: 4 g via INTRAVENOUS
  Filled 2013-08-09: qty 500

## 2013-08-09 MED ORDER — SIMETHICONE 80 MG PO CHEW
80.0000 mg | CHEWABLE_TABLET | Freq: Three times a day (TID) | ORAL | Status: DC
  Administered 2013-08-09 – 2013-08-12 (×10): 80 mg via ORAL
  Filled 2013-08-09 (×8): qty 1

## 2013-08-09 MED ORDER — FENTANYL CITRATE 0.05 MG/ML IJ SOLN
INTRAMUSCULAR | Status: DC | PRN
  Administered 2013-08-09: 50 ug via INTRAVENOUS

## 2013-08-09 MED ORDER — SCOPOLAMINE 1 MG/3DAYS TD PT72
1.0000 | MEDICATED_PATCH | Freq: Once | TRANSDERMAL | Status: AC
  Administered 2013-08-09: 1.5 mg via TRANSDERMAL

## 2013-08-09 MED ORDER — PHENYLEPHRINE HCL 10 MG/ML IJ SOLN
INTRAMUSCULAR | Status: DC | PRN
  Administered 2013-08-09: 80 ug via INTRAVENOUS

## 2013-08-09 MED ORDER — OXYTOCIN 40 UNITS IN LACTATED RINGERS INFUSION - SIMPLE MED: 62.5000 mL/h | INTRAVENOUS | Status: AC

## 2013-08-09 MED ORDER — LIDOCAINE HCL (CARDIAC) 20 MG/ML IV SOLN
INTRAVENOUS | Status: DC | PRN
  Administered 2013-08-09: 40 mg via INTRAVENOUS

## 2013-08-09 MED ORDER — PHENYLEPHRINE HCL 10 MG/ML IJ SOLN
INTRAMUSCULAR | Status: DC | PRN
  Administered 2013-08-09 (×4): 80 ug via INTRAVENOUS

## 2013-08-09 MED ORDER — DIPHENHYDRAMINE HCL 50 MG/ML IJ SOLN
12.5000 mg | INTRAMUSCULAR | Status: DC | PRN
  Administered 2013-08-09: 12.5 mg via INTRAVENOUS

## 2013-08-09 MED ORDER — BUPIVACAINE IN DEXTROSE 0.75-8.25 % IT SOLN
INTRATHECAL | Status: DC | PRN
  Administered 2013-08-09: 9 mg via INTRATHECAL

## 2013-08-09 MED ORDER — TETANUS-DIPHTH-ACELL PERTUSSIS 5-2.5-18.5 LF-MCG/0.5 IM SUSP
0.5000 mL | Freq: Once | INTRAMUSCULAR | Status: DC
  Filled 2013-08-09: qty 0.5

## 2013-08-09 MED ORDER — ZOLPIDEM TARTRATE 5 MG PO TABS: 5.0000 mg | ORAL_TABLET | Freq: Every evening | ORAL | Status: DC | PRN

## 2013-08-09 MED ORDER — LACTATED RINGERS IV SOLN
INTRAVENOUS | Status: DC | PRN
  Administered 2013-08-09: 13:00:00 via INTRAVENOUS

## 2013-08-09 MED ORDER — SODIUM CHLORIDE 0.9 % IJ SOLN: 3.0000 mL | INTRAMUSCULAR | Status: DC | PRN

## 2013-08-09 MED ORDER — MENTHOL 3 MG MT LOZG: 1.0000 | LOZENGE | OROMUCOSAL | Status: DC | PRN

## 2013-08-09 MED ORDER — IBUPROFEN 600 MG PO TABS
600.0000 mg | ORAL_TABLET | Freq: Four times a day (QID) | ORAL | Status: DC
  Administered 2013-08-09 – 2013-08-12 (×12): 600 mg via ORAL
  Filled 2013-08-09 (×12): qty 1

## 2013-08-09 MED ORDER — LACTATED RINGERS IV SOLN
INTRAVENOUS | Status: DC
  Administered 2013-08-09 – 2013-08-10 (×3): via INTRAVENOUS

## 2013-08-09 MED ORDER — LANOLIN HYDROUS EX OINT: 1.0000 "application " | TOPICAL_OINTMENT | CUTANEOUS | Status: DC | PRN

## 2013-08-09 MED ORDER — ONDANSETRON HCL 4 MG/2ML IJ SOLN: 4.0000 mg | INTRAMUSCULAR | Status: DC | PRN

## 2013-08-09 MED ORDER — DIPHENHYDRAMINE HCL 50 MG/ML IJ SOLN
INTRAMUSCULAR | Status: AC
  Filled 2013-08-09: qty 1

## 2013-08-09 MED ORDER — MEPERIDINE HCL 25 MG/ML IJ SOLN
INTRAMUSCULAR | Status: AC
  Filled 2013-08-09: qty 1

## 2013-08-09 MED ORDER — GLYCOPYRROLATE 0.2 MG/ML IJ SOLN
INTRAMUSCULAR | Status: DC | PRN
  Administered 2013-08-09: 0.3 mg via INTRAVENOUS

## 2013-08-09 MED ORDER — SIMETHICONE 80 MG PO CHEW
80.0000 mg | CHEWABLE_TABLET | ORAL | Status: DC | PRN
  Filled 2013-08-09 (×2): qty 1

## 2013-08-09 MED ORDER — SCOPOLAMINE 1 MG/3DAYS TD PT72
MEDICATED_PATCH | TRANSDERMAL | Status: AC
  Filled 2013-08-09: qty 1

## 2013-08-09 MED ORDER — CEFAZOLIN SODIUM-DEXTROSE 2-3 GM-% IV SOLR
INTRAVENOUS | Status: AC
  Filled 2013-08-09: qty 50

## 2013-08-09 MED ORDER — ONDANSETRON HCL 4 MG/2ML IJ SOLN: 4.0000 mg | Freq: Three times a day (TID) | INTRAMUSCULAR | Status: DC | PRN

## 2013-08-09 MED ORDER — CEFAZOLIN SODIUM-DEXTROSE 2-3 GM-% IV SOLR
2.0000 g | Freq: Once | INTRAVENOUS | Status: AC
  Administered 2013-08-09: 2 g via INTRAVENOUS

## 2013-08-09 MED ORDER — PNEUMOCOCCAL VAC POLYVALENT 25 MCG/0.5ML IJ INJ
0.5000 mL | INJECTION | INTRAMUSCULAR | Status: AC
  Filled 2013-08-09: qty 0.5

## 2013-08-09 MED ORDER — NALBUPHINE SYRINGE 5 MG/0.5 ML
5.0000 mg | INJECTION | INTRAMUSCULAR | Status: DC | PRN
  Filled 2013-08-09: qty 1

## 2013-08-09 MED ORDER — PROPOFOL 10 MG/ML IV BOLUS
INTRAVENOUS | Status: DC | PRN
  Administered 2013-08-09: 200 mg via INTRAVENOUS

## 2013-08-09 MED ORDER — NALOXONE HCL 0.4 MG/ML IJ SOLN: 0.4000 mg | INTRAMUSCULAR | Status: DC | PRN

## 2013-08-09 MED ORDER — SENNOSIDES-DOCUSATE SODIUM 8.6-50 MG PO TABS
2.0000 | ORAL_TABLET | ORAL | Status: DC
  Administered 2013-08-09 – 2013-08-12 (×3): 2 via ORAL
  Filled 2013-08-09 (×3): qty 2

## 2013-08-09 MED ORDER — SIMETHICONE 80 MG PO CHEW
80.0000 mg | CHEWABLE_TABLET | ORAL | Status: DC
  Administered 2013-08-09 – 2013-08-10 (×2): 80 mg via ORAL
  Filled 2013-08-09 (×2): qty 1

## 2013-08-09 MED ORDER — OXYTOCIN 10 UNIT/ML IJ SOLN
40.0000 [IU] | INTRAVENOUS | Status: DC | PRN
  Administered 2013-08-09: 40 [IU] via INTRAVENOUS

## 2013-08-09 MED ORDER — HYDROMORPHONE HCL PF 1 MG/ML IJ SOLN
0.2500 mg | INTRAMUSCULAR | Status: DC | PRN
  Administered 2013-08-09 (×2): 0.5 mg via INTRAVENOUS

## 2013-08-09 MED ORDER — HYDROMORPHONE HCL PF 1 MG/ML IJ SOLN
0.2500 mg | INTRAMUSCULAR | Status: DC | PRN
  Administered 2013-08-09 (×3): 0.5 mg via INTRAVENOUS

## 2013-08-09 MED ORDER — MEPERIDINE HCL 25 MG/ML IJ SOLN
INTRAMUSCULAR | Status: DC | PRN
  Administered 2013-08-09 (×2): 12.5 mg via INTRAVENOUS

## 2013-08-09 SURGICAL SUPPLY — 54 items
APL SKNCLS STERI-STRIP NONHPOA (GAUZE/BANDAGES/DRESSINGS)
BENZOIN TINCTURE PRP APPL 2/3 (GAUZE/BANDAGES/DRESSINGS) IMPLANT
BLADE SURG ROTATE 9660 (MISCELLANEOUS) IMPLANT
CANISTER SUCTION 2500CC (MISCELLANEOUS) ×2 IMPLANT
DRAPE LAPAROTOMY 100X72X124 (DRAPES) ×2 IMPLANT
DRAPE POUCH INSTRU U-SHP 10X18 (DRAPES) ×2 IMPLANT
DURAPREP 26ML APPLICATOR (WOUND CARE) IMPLANT
ELECT REM PT RETURN 9FT ADLT (ELECTROSURGICAL) ×2
ELECTRODE REM PT RTRN 9FT ADLT (ELECTROSURGICAL) ×1 IMPLANT
GAUZE SPONGE 4X4 16PLY XRAY LF (GAUZE/BANDAGES/DRESSINGS) IMPLANT
GLOVE BIO SURGEON STRL SZ 6.5 (GLOVE) IMPLANT
GLOVE BIO SURGEON STRL SZ7 (GLOVE) IMPLANT
GLOVE BIO SURGEON STRL SZ7.5 (GLOVE) IMPLANT
GLOVE BIO SURGEON STRL SZ8 (GLOVE) IMPLANT
GLOVE BIO SURGEON STRL SZ8.5 (GLOVE) IMPLANT
GLOVE BIOGEL M 8.0 STRL (GLOVE) ×2 IMPLANT
GLOVE ECLIPSE 6.5 STRL STRAW (GLOVE) IMPLANT
GLOVE ECLIPSE 7.0 STRL STRAW (GLOVE) IMPLANT
GLOVE ECLIPSE 7.5 STRL STRAW (GLOVE) IMPLANT
GLOVE ECLIPSE 8.0 STRL XLNG CF (GLOVE) IMPLANT
GLOVE ECLIPSE 8.5 STRL (GLOVE) IMPLANT
GLOVE EXAM NITRILE LRG STRL (GLOVE) IMPLANT
GLOVE EXAM NITRILE MD LF STRL (GLOVE) IMPLANT
GLOVE EXAM NITRILE XL STR (GLOVE) IMPLANT
GLOVE EXAM NITRILE XS STR PU (GLOVE) IMPLANT
GLOVE INDICATOR 6.5 STRL GRN (GLOVE) IMPLANT
GLOVE INDICATOR 7.0 STRL GRN (GLOVE) IMPLANT
GLOVE INDICATOR 7.5 STRL GRN (GLOVE) IMPLANT
GLOVE INDICATOR 8.0 STRL GRN (GLOVE) IMPLANT
GLOVE INDICATOR 8.5 STRL (GLOVE) IMPLANT
GLOVE OPTIFIT SS 8.0 STRL (GLOVE) IMPLANT
GLOVE SURG SS PI 6.5 STRL IVOR (GLOVE) IMPLANT
GOWN BRE IMP SLV AUR LG STRL (GOWN DISPOSABLE) ×2 IMPLANT
GOWN BRE IMP SLV AUR XL STRL (GOWN DISPOSABLE) IMPLANT
GOWN STRL REIN 2XL LVL4 (GOWN DISPOSABLE) IMPLANT
KIT BASIN OR (CUSTOM PROCEDURE TRAY) ×2 IMPLANT
KIT ROOM TURNOVER OR (KITS) ×2 IMPLANT
NS IRRIG 1000ML POUR BTL (IV SOLUTION) ×2 IMPLANT
PACK LAMINECTOMY NEURO (CUSTOM PROCEDURE TRAY) ×2 IMPLANT
PAD ARMBOARD 7.5X6 YLW CONV (MISCELLANEOUS) ×6 IMPLANT
SPONGE GAUZE 4X4 12PLY (GAUZE/BANDAGES/DRESSINGS) ×2 IMPLANT
SPONGE SURGIFOAM ABS GEL SZ50 (HEMOSTASIS) ×2 IMPLANT
STRIP CLOSURE SKIN 1/2X4 (GAUZE/BANDAGES/DRESSINGS) ×1 IMPLANT
SUT VIC AB 0 CT1 18XCR BRD8 (SUTURE) ×1 IMPLANT
SUT VIC AB 0 CT1 8-18 (SUTURE) ×2
SUT VIC AB 2-0 CP2 18 (SUTURE) ×2 IMPLANT
SUT VIC AB 3-0 SH 8-18 (SUTURE) ×2 IMPLANT
SWAB CULTURE LIQ STUART DBL (MISCELLANEOUS) ×2 IMPLANT
SYR 20ML ECCENTRIC (SYRINGE) ×2 IMPLANT
TAPE CLOTH SURG 4X10 WHT LF (GAUZE/BANDAGES/DRESSINGS) ×1 IMPLANT
TOWEL OR 17X24 6PK STRL BLUE (TOWEL DISPOSABLE) ×2 IMPLANT
TOWEL OR 17X26 10 PK STRL BLUE (TOWEL DISPOSABLE) ×2 IMPLANT
TUBE ANAEROBIC SPECIMEN COL (MISCELLANEOUS) ×2 IMPLANT
WATER STERILE IRR 1000ML POUR (IV SOLUTION) ×2 IMPLANT

## 2013-08-09 NOTE — Progress Notes (Signed)
New admit - rec'd pt from PACU via stretcher following RCS.  Delivered viable female infant at 62 with spinal anesthesia.  Pt transferred easily from stretcher to bed with minimal assistance.  Bending knees, full movement BLE.  Foley catheter in place, patent and to BSD.   O2 per [place] at 2 L.  Magnesium Sulfate inf at 2gms/51ml/hr.  ML fluids LR at 120ml/hr.  Pt alert and responsive, oriented to room.  SR up x 2, call light at bedside.  Husband at bs.

## 2013-08-09 NOTE — Op Note (Signed)
Cesarean Section Procedure Note  Indications: 1.39 5/7wks 2.Preeclampsia 3.Repeat C-SEction  Pre-operative Diagnosis: Preeclampsia, Previous Cesarean Section    Post-operative Diagnosis: Preeclampsia, Previous Cesarean Section   Procedure: REPEAT CESAREAN SECTION  Surgeon: Purcell Nails, MD    Assistants: Nigel Bridgeman, CNM  Anesthesia: Spinal  Anesthesiologist: Sandrea Hughs., MD   Procedure Details  The patient was taken to the operating room secondary to repeat c-section and preeclampsia after the risks, benefits, complications, treatment options, and expected outcomes were discussed with the patient.  The patient concurred with the proposed plan, giving informed consent which was signed and witnessed. The patient was taken to Operating Room C-Section Suite, identified as Dana Stewart and the procedure verified as C-Section Delivery. A Time Out was held and the above information confirmed.  After induction of anesthesia by obtaining a spinal, the patient was prepped and draped in the usual sterile manner. A Pfannenstiel skin incision was made and carried down through the subcutaneous tissue to the underlying layer of fascia.  The fascia was incised bilaterally and extended transversely bilaterally with the Mayo scissors. Kocher clamps were placed on the inferior aspect of the fascial incision and the underlying rectus muscle was separated from the fascia. The same was done on the superior aspect of the fascial incision.  The peritoneum was identified, entered bluntly and extended manually. The utero-vesical peritoneal reflection was incised transversely and the bladder flap was bluntly freed from the lower uterine segment. A low transverse uterine incision was made with the scalpel and extended bilaterally with the bandage scissors.  The infant was delivered in vertex position without difficulty.  After the umbilical cord was clamped and cut, the infant was handed to the  awaiting pediatricians.  Cord blood was obtained for evaluation.  The placenta was removed intact and appeared to be within normal limits. The uterus was cleared of all clots and debris. The uterine incision was closed with running interlocking sutures of 0 Vicryl and a second imbricating layer was performed as well.   Bilateral tubes and ovaries appeared to be within normal limits.  Good hemostasis was noted.  Copious irrigation was performed until clear.  The peritoneum was repaired with 2-0 chromic via a running suture.  The fascia was reapproximated with a running suture of 0 Vicryl. The subcutaneous tissue was reapproximated with 3 interrupted sutures of 2-0 plain.  The skin was reapproximated with a subcuticular suture of 3-0 monocryl.  Steristrips were applied.  Instrument, sponge, and needle counts were correct prior to abdominal closure and at the conclusion of the case.  The patient was awaiting transfer to the recovery room in good condition.  Findings: Live female infant with Apgars 8 at one minute and 9 at five minutes.  Normal appearing bilateral ovaries and fallopian tubes were noted.  Estimated Blood Loss:  700 ml         Drains: foley to gravity 100 cc         Total IV Fluids: 1000 ml         Specimens to Pathology: Placenta         Complications:  None; patient tolerated the procedure well.         Disposition: PACU - hemodynamically stable.         Condition: stable  Attending Attestation: I performed the procedure.

## 2013-08-09 NOTE — Anesthesia Procedure Notes (Signed)
Spinal  Patient location during procedure: OR Start time: 08/09/2013 12:05 AM End time: 08/09/2013 12:14 AM Reason for block: procedure for pain Staffing Anesthesiologist: Sandrea Hughs Performed by: anesthesiologist  Preanesthetic Checklist Completed: patient identified, surgical consent, pre-op evaluation, timeout performed, IV checked, risks and benefits discussed and monitors and equipment checked Spinal Block Patient position: sitting Prep: DuraPrep Patient monitoring: heart rate, cardiac monitor, continuous pulse ox and blood pressure Approach: midline Location: L3-4 Injection technique: single-shot Needle Needle type: Sprotte  Needle gauge: 24 G Needle length: 12.7 cm Needle insertion depth: 7 cm Assessment Sensory level: T4 Additional Notes The tip of the Pencan in the original kit may have broken off against the bone so will check an Xray once the surgery is over.

## 2013-08-09 NOTE — Progress Notes (Addendum)
Wes, RN from [facility] called to report that pt would be arriving in 10 min. Dr Normand Sloop notified that pt is on her way back.

## 2013-08-09 NOTE — Progress Notes (Signed)
Pt returned via [facility] from [facility].

## 2013-08-09 NOTE — Progress Notes (Signed)
Patient to x-ray for spinal film

## 2013-08-09 NOTE — Anesthesia Postprocedure Evaluation (Signed)
  Anesthesia Post-op Note  Patient: Dana Stewart  Procedure(s) Performed: Procedure(s): Repeat Cesarean Section Delivery Baby Girl @ [phone], Apgars 8/9  (N/A)  Patient Location: ICU  Anesthesia Type:Spinal  Level of Consciousness: awake, alert , oriented and patient cooperative  Airway and Oxygen Therapy: Patient Spontanous Breathing and Patient connected to nasal cannula oxygen 2L/min with SaO2 100%  Post-op Pain: mild  Post-op Assessment: Patient's Cardiovascular Status Stable, Respiratory Function Stable and No residual numbness, no headache, mild back pain  Post-op Vital Signs: stable  Complications: No apparent anesthesia complications other than what is noted in Dr. Flonnie Overman note.  Tx planned to [facility] later this morning.

## 2013-08-09 NOTE — Progress Notes (Signed)
Ur chart review completed.  

## 2013-08-09 NOTE — Preoperative (Signed)
Beta Blockers   Reason not to administer Beta Blockers:Not Applicable 

## 2013-08-09 NOTE — Lactation Note (Signed)
This note was copied from the chart of Dana Stewart. Lactation Consultation Note    Initial consult with this mom of a term baby, in AICU. Mom breast fed earlier today, but mom was at [facility] for surgery to remove a pice of a spinal needle. Mom did nto come back until after 4 pm. The baby was offered formula by bottle in the CNS, but did not feed. She is now 64 hours old, and sleepy. I placed her skin to skin with mom, and hand expressed 2 mls of colostrum, which baby sucked down off the spoon. She was then cuing, and latched well in football hold, with strong suckles and visible swallows. Mom has lots of easily expressed colostrum. Teaching done on breast feeding from the Baby and Me book. Mom knows to call for questions/concerns.   Patient Name: Dana Stewart [id number] Date: 08/09/2013 Reason for consult: Initial assessment   Maternal Data Formula Feeding for Exclusion: Yes Reason for exclusion: Admission to Intensive Care Unit (ICU) post-partum Has patient been taught Hand Expression?: Yes Does the patient have breastfeeding experience prior to this delivery?: Yes  Feeding Feeding Type: Breast Fed  LATCH Score/Interventions Latch: Repeated attempts needed to sustain latch, nipple held in mouth throughout feeding, stimulation needed to elicit sucking reflex. (after 5 minutes, baby vigorous at breast with deep latch and swallows) Intervention(s): Adjust position;Assist with latch;Breast compression (football hold - large breasts)  Audible Swallowing: A few with stimulation Intervention(s): Skin to skin;Hand expression  Type of Nipple: Everted at rest and after stimulation  Comfort (Breast/Nipple): Soft / non-tender     Hold (Positioning): Assistance needed to correctly position infant at breast and maintain latch. Intervention(s): Breastfeeding basics reviewed;Support Pillows;Position options;Skin to skin  LATCH Score: 7  Lactation Tools Discussed/Used      Consult Status Consult Status: Follow-up Date: 08/10/13 Follow-up type: In-patient    Alfred Levins 08/09/2013, 7:41 PM

## 2013-08-09 NOTE — Progress Notes (Signed)
Patient who had a C-section using and epidural catheter. After the delivery of the baby , removing the catheter the needle broke off. X-ray of the lumbar spine shows a needle portion at the level of l23. The patient is stable on iv magnesium sulphate . The patient by mutual agreement with the ob-gyn was transferred to our neuro pacu to have the needle removed and once she is stable [facility]

## 2013-08-09 NOTE — Anesthesia Procedure Notes (Addendum)
Procedure Name: Intubation Date/Time: 08/09/2013 1:11 PM Performed by: Gayla Medicus Pre-anesthesia Checklist: Patient identified, Timeout performed, Emergency Drugs available, Suction available and Patient being monitored Patient Re-evaluated:Patient Re-evaluated prior to inductionOxygen Delivery Method: Circle system utilized Preoxygenation: Pre-oxygenation with 100% oxygen Intubation Type: IV induction, Cricoid Pressure applied and Rapid sequence Laryngoscope Size: Mac and 3 Grade View: Grade I Tube type: Oral Tube size: 7.0 mm Number of attempts: 1 Airway Equipment and Method: Stylet and Video-laryngoscopy Placement Confirmation: ETT inserted through vocal cords under direct vision,  positive ETCO2 and breath sounds checked- equal and bilateral Secured at: 22 cm Tube secured with: Tape Dental Injury: Teeth and Oropharynx as per pre-operative assessment  Difficulty Due To: Difficult Airway-  due to edematous airway

## 2013-08-09 NOTE — Progress Notes (Signed)
Spoke with [facility] re planned transfer to [facility] for surgery - time pending.

## 2013-08-09 NOTE — Progress Notes (Signed)
Magnesium sulfate started back at 12.5 ml/hr. same pre-op per Dr. Katrinka Blazing.

## 2013-08-09 NOTE — Anesthesia Preprocedure Evaluation (Addendum)
Anesthesia Evaluation  Patient identified by MRN, date of birth, ID band Patient awake    Reviewed: Allergy & Precautions, H&P , NPO status , Patient's Chart, lab work & pertinent test results  Airway Mallampati: II TM Distance: >3 FB Neck ROM: Full    Dental  (+) Teeth Intact and Dental Advisory Given   Pulmonary asthma ,          Cardiovascular hypertension,     Neuro/Psych    GI/Hepatic GERD-  ,  Endo/Other  Hypothyroidism   Renal/GU      Musculoskeletal   Abdominal   Peds  Hematology   Anesthesia Other Findings Postpartum 11 hours Baby girl  Reproductive/Obstetrics                          Anesthesia Physical Anesthesia Plan  ASA: II  Anesthesia Plan: General   Post-op Pain Management:    Induction: Intravenous  Airway Management Planned: Oral ETT  Additional Equipment:   Intra-op Plan:   Post-operative Plan: Extubation in OR  Informed Consent: I have reviewed the patients History and Physical, chart, labs and discussed the procedure including the risks, benefits and alternatives for the proposed anesthesia with the patient or authorized representative who has indicated his/her understanding and acceptance.   Dental advisory given  Plan Discussed with: Anesthesiologist, Surgeon and CRNA  Anesthesia Plan Comments:        Anesthesia Quick Evaluation

## 2013-08-09 NOTE — Anesthesia Postprocedure Evaluation (Signed)
Anesthesia Post Note  Patient: Dana Stewart  Procedure(s) Performed: Procedure(s) (LRB): Repeat Cesarean Section Delivery Baby Girl @ 0045, Apgars 8/9  (N/A)  Anesthesia type: Spinal  Patient location: PACU  Post pain: Pain level controlled  Post assessment: Post-op Vital signs reviewed  Last Vitals:  Filed Vitals:   08/09/13 0230  BP: 148/83  Pulse: 90  Temp:   Resp: 21    Post vital signs: Reviewed  Level of consciousness: awake  Complications: No apparent anesthesia complications pending review of xray of lumbar spine. Dr. Su Hilt aware.

## 2013-08-09 NOTE — Anesthesia Postprocedure Evaluation (Signed)
  Anesthesia Post-op Note  Patient: Dana Stewart  Procedure(s) Performed: Procedure(s): Removal of Foreign Body (N/A)  Patient Location: PACU  Anesthesia Type:General  Level of Consciousness: awake, alert , oriented and patient cooperative  Airway and Oxygen Therapy: Patient Spontanous Breathing  Post-op Pain: mild  Post-op Assessment: Post-op Vital signs reviewed, Patient's Cardiovascular Status Stable, Respiratory Function Stable, Patent Airway, No signs of Nausea or vomiting and Pain level controlled  Post-op Vital Signs: stable  Complications: No apparent anesthesia complications

## 2013-08-09 NOTE — Evaluation (Signed)
Just passed patient's husband on the way to see the patient. He said things are good. Patient is in the room with her daughter resting comfortably. AICU nurse says patient has been ambulating without difficulty. Last antibiotic dose will be at 0200. Review of needle removal showed no anesthetic or surgical complications.

## 2013-08-09 NOTE — Transfer of Care (Signed)
Immediate Anesthesia Transfer of Care Note  Patient: Dana Stewart  Procedure(s) Performed: Procedure(s): Repeat Cesarean Section Delivery Baby Girl @ 0045, Apgars 8/9  (N/A)  Patient Location: PACU  Anesthesia Type:Spinal  Level of Consciousness: awake, alert  and oriented  Airway & Oxygen Therapy: Patient Spontanous Breathing Level of cardio  And stability  Vital sighs stable. Complications: No apparent anesthesia complications

## 2013-08-09 NOTE — Progress Notes (Signed)
Pt transported to [facility] via [facility] for surgery by Dr Jeral Fruit. Report given by Brayton El, RN

## 2013-08-09 NOTE — Progress Notes (Signed)
Report rec'd from Byrd Hesselbach, RN in [facility] PACU - waiting on [facility] to transport pt back to [facility]

## 2013-08-09 NOTE — Transfer of Care (Signed)
Immediate Anesthesia Transfer of Care Note  Patient: Dana Stewart  Procedure(s) Performed: Procedure(s): Removal of Foreign Body (N/A)  Patient Location: PACU  Anesthesia Type:General  Level of Consciousness: awake, alert  and oriented  Airway & Oxygen Therapy: Patient Spontanous Breathing and Patient connected to nasal cannula oxygen  Post-op Assessment: Report given to PACU RN, Post -op Vital signs reviewed and stable and Patient moving all extremities X 4  Post vital signs: Reviewed and stable  Complications: No apparent anesthesia complications

## 2013-08-09 NOTE — Progress Notes (Signed)
Received notification from [facility] at 0444 regarding piece of spinal needle fragment remaining in back - finding confirmed by radiology.  Spoke with Dr. Su Hilt who has discussed need for retrieval with Dr. Jeral Fruit at [facility].  Orders for pt to be NPO.  Dr. Arby Barrette will be up to talk with patient re findings and plan of care.  Pt alert and responsive with good movement of bilateral lower extremities, denies decreased sensation to touch.  Foley catheter in place, SCD's.

## 2013-08-09 NOTE — Evaluation (Signed)
Just spoke with the patient and her husband about the radiologic finding of a 4.8cm spinal needle fragment at L2-L3. I had previously discussed the finding with Dr. Su Hilt and we agreed that Neurosurgical consultation was warranted. She spoke to Dr. Jeral Fruit who has scheduled her to have the retained needle fragment removed at 7:00a.m. Over at [facility]. They both understand the need for removal and had questions about the length of the procedure. I expressed to them that the procedure should not take more than one hour.

## 2013-08-09 NOTE — Progress Notes (Signed)
Spoke with Dr. Jeral Fruit - plan of care discussed and clarified.  He will discuss case with anesthesia at [facility] and call back with approx time of surgery.

## 2013-08-10 ENCOUNTER — Encounter (HOSPITAL_COMMUNITY): Payer: Self-pay | Admitting: Obstetrics and Gynecology

## 2013-08-10 MED ORDER — SODIUM CHLORIDE 0.9 % IJ SOLN: 3.0000 mL | Freq: Two times a day (BID) | INTRAMUSCULAR | Status: DC

## 2013-08-10 MED ORDER — SODIUM CHLORIDE 0.9 % IV SOLN: 250.0000 mL | INTRAVENOUS | Status: DC | PRN

## 2013-08-10 MED ORDER — LABETALOL HCL 5 MG/ML IV SOLN
10.0000 mg | INTRAVENOUS | Status: DC | PRN
  Filled 2013-08-10: qty 8

## 2013-08-10 MED ORDER — LEVOTHYROXINE SODIUM 75 MCG PO TABS
75.0000 ug | ORAL_TABLET | Freq: Every day | ORAL | Status: DC
  Administered 2013-08-10 – 2013-08-12 (×3): 75 ug via ORAL
  Filled 2013-08-10 (×4): qty 1

## 2013-08-10 MED ORDER — SODIUM CHLORIDE 0.9 % IJ SOLN: 3.0000 mL | INTRAMUSCULAR | Status: DC | PRN

## 2013-08-10 MED ORDER — LABETALOL HCL 200 MG PO TABS
200.0000 mg | ORAL_TABLET | Freq: Two times a day (BID) | ORAL | Status: DC
  Administered 2013-08-10 – 2013-08-12 (×4): 200 mg via ORAL
  Filled 2013-08-10 (×6): qty 1

## 2013-08-10 NOTE — Op Note (Signed)
Dana Stewart, Dana Stewart             ACCOUNT NO.:  [id number]  MEDICAL RECORD NO.:  [id number]  LOCATION:  9374                          FACILITY:  [facility]  PHYSICIAN:  Hilda Lias, M.D.   DATE OF BIRTH:  February 01, 1981  DATE OF PROCEDURE:  08/09/2013 DATE OF DISCHARGE:                              OPERATIVE REPORT   PREOPERATIVE DIAGNOSIS:  Status post C-section.  Retained broken needle at the level of L2-L3 spinous process.  POSTOPERATIVE DIAGNOSIS:  Status post C-section.  Retained broken needle at the level of L2-L3 spinous process.  PROCEDURE:  Exploration of the L2-L3 space, removal of broken needle.  SURGEON:  Hilda Lias, MD  ASSISTANT:  Coletta Memos, MD  CLINICAL HISTORY:  The lady had C-section yesterday with epidural and is thinned down when the needle was removed; it broke off.  We were called and the patient was transferred to [facility] to the PACU.  I talked to her and the fiance and explained to them about the situation. They both of them fully consented with the procedure.  DESCRIPTION OF PROCEDURE:  The patient was taken to the OR and after intubation, she was positioned in a prone manner.  The lumbar area was cleaned with DuraPrep and drapes were applied.  We sewed the opening in the skin where the needle went through.  I made incision about an inch and I went through the skin and subcutaneous tissue and to a thick adipose tissue.  I retracted between L2-L3, and finally I was able to see the tip of the needle between the L2-L3 _interspinous_________ space.  Removal was done with hemostat.  The broken needle came in toto.  When I compared with the x-rays showed the same length.  Then, the area was irrigated.  The wound was closed with Vicryl for the subcutaneous space and the skin with Steri-Strips.  The patient is going to go back to PACU; and she is going to be transferred by ambulance to [facility].  She is getting magnesium sulfate intravenous  for symptom of pre and post eclampsia.          ______________________________ Hilda Lias, M.D.     EB/MEDQ  D:  08/09/2013  T:  08/10/2013  Job:  [id number]

## 2013-08-10 NOTE — Progress Notes (Addendum)
Subjective: Postpartum Day 1: Cesarean Delivery Patient reports incisional pain, tolerating PO, + flatus and no problems voiding.  Objective: Vital signs in last 24 hours: Temp:  [98.1 F (36.7 C)-98.6 F (37 C)] 98.6 F (37 C) (10/16 1200) Pulse Rate:  [76-112] 91 (10/16 1428) Resp:  [16-22] 16 (10/16 1428) BP: (121-171)/(60-93) 143/82 mmHg (10/16 1418) SpO2:  [94 %-99 %] 97 % (10/16 1428) Weight:  [107.276 kg (236 lb 8 oz)] 107.276 kg (236 lb 8 oz) (10/16 0600)  Physical Exam:  General: alert and no distress Lochia: appropriate Uterine Fundus: firm Incision: healing well, dressing intact DVT Evaluation: No evidence of DVT seen on physical exam. Dressing on back c/d/i   Recent Labs  08/08/13 1926 08/09/13 0630  HGB 9.5* 9.2*  HCT 29.4* 27.8*    Assessment/Plan: Status post Cesarean section. Postoperative course complicated by preeclampsia on Mg for 24hrs and Neurosurgery secondary to needle that broke off in back at the time of the spinal.   Cont current care RN to call neurosurgeon to find out when back bandage can be removed so pt can shower Doing well s/p mg with no symptoms.  Will cont to monitor BPs.  Yonael Tulloch Y 08/10/2013, 5:50 PM

## 2013-08-10 NOTE — Progress Notes (Signed)
Called Haroldine Laws CNM regarding patient's 2200 BP. BP with large cuff 157/96; pt asymptomatic. Victorino Dike to enter orders. Will continue to monitor patient.

## 2013-08-11 DIAGNOSIS — Z98891 History of uterine scar from previous surgery: Secondary | ICD-10-CM

## 2013-08-11 NOTE — Progress Notes (Signed)
Subjective: Postpartum Day 2: Cesarean Delivery due to previous C/S, pre-eclampsia, unfavorable cervix. Denies HA, visual sx, epigastric pain. Hx retained broken spinal needle, s/p removal by Dr. Jeral Fruit Patient up ad lib, reports no syncope or dizziness. Feeding:  Breast Contraceptive plan:  Undecided Labetalol 200 mg po BID begun last night for elevated BP.  Objective: Vital signs in last 24 hours: Temp:  [98.3 F (36.8 C)-99.4 F (37.4 C)] 98.4 F (36.9 C) (10/17 0550) Pulse Rate:  [86-96] 86 (10/17 1000) Resp:  [16-20] 20 (10/17 0550) BP: (132-157)/(72-96) 150/87 mmHg (10/17 1000) SpO2:  [95 %-100 %] 97 % (10/17 0149)  Filed Vitals:   08/10/13 2200 08/11/13 0149 08/11/13 0550 08/11/13 1000  BP: 157/96 135/85 132/83 150/87  Pulse: 92 89 92 86  Temp: 99.4 F (37.4 C) 98.8 F (37.1 C) 98.4 F (36.9 C)   TempSrc: Oral Oral Oral   Resp: 20 20 20    Height:      Weight:      SpO2: 100% 97%     1st dose Labetalol given at 10pm last night.  Physical Exam:  General: alert Lochia: appropriate Uterine Fundus: firm Incision: C/S dressing CDI, Back incision dressing removed already--steri-strips in place DVT Evaluation: No evidence of DVT seen on physical exam. Negative Homan's sign. JP drain:   NA   Recent Labs  08/08/13 1926 08/09/13 0630  HGB 9.5* 9.2*  HCT 29.4* 27.8*    Assessment/Plan: Status post Cesarean section day 2. S/P surgical removal of broken spinal needle Doing well postoperatively.  Continue current care. Plan for discharge tomorrow  Patient reports Dr. Jeral Fruit plans f/u at his office in 2 weeks--she is to call for appt.  Patient will need contact information and information on office location at d/c.  Nigel Bridgeman 08/11/2013, 12:48 PM

## 2013-08-12 MED ORDER — LABETALOL HCL 200 MG PO TABS: 200.0000 mg | ORAL_TABLET | Freq: Two times a day (BID) | ORAL | Status: AC

## 2013-08-12 MED ORDER — IBUPROFEN 600 MG PO TABS: 600.0000 mg | ORAL_TABLET | Freq: Four times a day (QID) | ORAL | Status: AC

## 2013-08-12 MED ORDER — OXYCODONE-ACETAMINOPHEN 5-325 MG PO TABS: 1.0000 | ORAL_TABLET | ORAL | Status: AC | PRN

## 2013-08-12 NOTE — Lactation Note (Signed)
This note was copied from the chart of Girl Joshlynn Alfonzo. Lactation Consultation Note  Patient Name: Girl Haasini Patnaude [id number] Date: 08/12/2013 Reason for consult: Follow-up assessment Mom reports some nipple tenderness, she is using comfort gels and lanolin. Advised not to use together. Care for sore nipples reviewed. Mom reports her breasts are feeling more full. She has pumped some EBM. Last night she received greater than 50 ml. Baby recently fed so did not see latch. Reviewed BF basics. Monitor voids/stools. Advised of OP services and support group. Questions answered. Engorgement care reviewed if needed.   Maternal Data    Feeding Feeding Type: Breast Milk Length of feed: 15 min  LATCH Score/Interventions                      Lactation Tools Discussed/Used     Consult Status Consult Status: Complete Date: 08/12/13 Follow-up type: In-patient    Alfred Levins 08/12/2013, 12:52 PM

## 2013-08-12 NOTE — Plan of Care (Signed)
Problem: Problem: Cardiovascular Progression Goal: NO BP ELEVATION Outcome: Not Met (add Reason) MD aware of BP's; discharged on blood pressure meds and S/S to report to MD

## 2013-08-12 NOTE — Discharge Summary (Signed)
Obstetric Discharge Summary Reason for Admission: Blood pressure/Mild preeclampsia Prenatal Procedures: ultrasound Intrapartum Procedures: cesarean: low cervical, transverse Postpartum Procedures: Removal of retained spinal needle. Complications-Operative and Postpartum: Broken spinal needle at L2-L3 spinous processes  Recent Results (from the past 2160 hour(s))  OB RESULTS CONSOLE GBS     Status: None   Collection Time    07/13/13 12:00 AM      Result Value Range   GBS Negative    COMPREHENSIVE METABOLIC PANEL     Status: Abnormal   Collection Time    08/08/13  7:26 PM      Result Value Range   Sodium 137  135 - 145 mEq/L   Potassium 3.9  3.5 - 5.1 mEq/L   Chloride 103  96 - 112 mEq/L   CO2 23  19 - 32 mEq/L   Glucose, Bld 73  70 - 99 mg/dL   BUN 6  6 - 23 mg/dL   Creatinine, Ser 8.46  0.50 - 1.10 mg/dL   Calcium 9.3  8.4 - 96.2 mg/dL   Total Protein 6.5  6.0 - 8.3 g/dL   Albumin 2.6 (*) 3.5 - 5.2 g/dL   AST 18  0 - 37 U/L   ALT 10  0 - 35 U/L   Alkaline Phosphatase 162 (*) 39 - 117 U/L   Total Bilirubin 0.5  0.3 - 1.2 mg/dL   GFR calc non Af Amer >90  >90 mL/min   GFR calc Af Amer >90  >90 mL/min   Comment: (NOTE)     The eGFR has been calculated using the CKD EPI equation.     This calculation has not been validated in all clinical situations.     eGFR's persistently <90 mL/min signify possible Chronic Kidney     Disease.  CBC     Status: Abnormal   Collection Time    08/08/13  7:26 PM      Result Value Range   WBC 7.1  4.0 - 10.5 K/uL   RBC 4.50  3.87 - 5.11 MIL/uL   Hemoglobin 9.5 (*) 12.0 - 15.0 g/dL   HCT 95.2 (*) 84.1 - 32.4 %   MCV 65.3 (*) 78.0 - 100.0 fL   MCH 21.1 (*) 26.0 - 34.0 pg   Comment: REPEATED TO VERIFY   MCHC 32.3  30.0 - 36.0 g/dL   RDW 40.1 (*) 02.7 - 25.3 %   Platelets 280  150 - 400 K/uL  LACTATE DEHYDROGENASE     Status: None   Collection Time    08/08/13  7:26 PM      Result Value Range   LDH 198  94 - 250 U/L  URIC ACID     Status:  None   Collection Time    08/08/13  7:26 PM      Result Value Range   Uric Acid, Serum 5.2  2.4 - 7.0 mg/dL  PROTEIN / CREATININE RATIO, URINE     Status: Abnormal   Collection Time    08/08/13  8:50 PM      Result Value Range   Creatinine, Urine 46.64     Total Protein, Urine 17.1     Comment: NO NORMAL RANGE ESTABLISHED FOR THIS TEST   PROTEIN CREATININE RATIO 0.37 (*) 0.00 - 0.15   Comment: Performed at [facility]  TYPE AND SCREEN     Status: None   Collection Time    08/08/13 11:10 PM      Result Value  Range   ABO/RH(D) A POS     Antibody Screen NEG     Sample Expiration 08/11/2013    RPR     Status: None   Collection Time    08/08/13 11:10 PM      Result Value Range   RPR NON REACTIVE  NON REACTIVE   Comment: Performed at [facility]  ABO/RH     Status: None   Collection Time    08/08/13 11:10 PM      Result Value Range   ABO/RH(D) A POS    CBC     Status: Abnormal   Collection Time    08/09/13  6:30 AM      Result Value Range   WBC 9.7  4.0 - 10.5 K/uL   RBC 4.26  3.87 - 5.11 MIL/uL   Hemoglobin 9.2 (*) 12.0 - 15.0 g/dL   HCT 40.9 (*) 81.1 - 91.4 %   MCV 65.3 (*) 78.0 - 100.0 fL   MCH 21.6 (*) 26.0 - 34.0 pg   MCHC 33.1  30.0 - 36.0 g/dL   RDW 78.2 (*) 95.6 - 21.3 %   Platelets 257  150 - 400 K/uL  COMPREHENSIVE METABOLIC PANEL     Status: Abnormal   Collection Time    08/09/13  6:30 AM      Result Value Range   Sodium 138  135 - 145 mEq/L   Potassium 3.7  3.5 - 5.1 mEq/L   Chloride 105  96 - 112 mEq/L   CO2 22  19 - 32 mEq/L   Glucose, Bld 86  70 - 99 mg/dL   BUN 5 (*) 6 - 23 mg/dL   Creatinine, Ser 0.86  0.50 - 1.10 mg/dL   Calcium 8.8  8.4 - 57.8 mg/dL   Total Protein 6.2  6.0 - 8.3 g/dL   Albumin 2.4 (*) 3.5 - 5.2 g/dL   AST 20  0 - 37 U/L   ALT 10  0 - 35 U/L   Alkaline Phosphatase 142 (*) 39 - 117 U/L   Total Bilirubin 0.5  0.3 - 1.2 mg/dL   GFR calc non Af Amer >90  >90 mL/min   GFR calc Af Amer >90  >90 mL/min   Comment:  (NOTE)     The eGFR has been calculated using the CKD EPI equation.     This calculation has not been validated in all clinical situations.     eGFR's persistently <90 mL/min signify possible Chronic Kidney     Disease.  LACTATE DEHYDROGENASE     Status: None   Collection Time    08/09/13  6:30 AM      Result Value Range   LDH 219  94 - 250 U/L  MAGNESIUM     Status: Abnormal   Collection Time    08/09/13  6:30 AM      Result Value Range   Magnesium 3.6 (*) 1.5 - 2.5 mg/dL  URIC ACID     Status: None   Collection Time    08/09/13  6:30 AM      Result Value Range   Uric Acid, Serum 5.7  2.4 - 7.0 mg/dL    Pt admitted from office due to elevated blood pressure.  Lab eval in MAU with elevated protein/creat ratio c/w preeclampsia.  Due to need for possible IOL and hx previous LTCS, pt declines TOLAC as previously documented and requested repeat LTCS.  Pt underwent repeat LTCS.  Procedure complicated  by retention of a fragment of spinal needle with spinal placement.  Neurosurgery consult obtained and pt transferred to [facility] where she underwent removal of the needle fragment by Dr. Jeral Fruit without complications.  Pt received magnesium sulfate IV x 24hrs for seizure prophylaxis.  Pt started on Labetalol 200mg  po bid after delivery.  Remained afebrile as wel as tol po liquids and solids as well as ambulating and voiding without difficulty.  Pain well controlled with medications.  Repeat LFTs and platelets post delivery were WNL.  BPs controlled with Labetalol post delivery.  Denies any preeclampsia s/s at discharge.  Physical Exam:  General: alert, cooperative and no distress Heart:  RRR Lungs:  CTA bilat Breasts:  Soft Abd:  Soft, non-tender, pos BS x 4 quads Lochia: appropriate, sm rubra Uterine Fundus: firm, NT 1 below umb Incision: healing well, occlusive dsg removed due to non-adherent.  Steri strips intact.  No redness or drainage.               Lumbar incision clean, dry,  intact without redness or drainage. DVT Evaluation: No evidence of DVT seen on physical exam. Negative Homan's sign bilat. No significant calf/ankle edema.  Filed Vitals:   08/11/13 1845 08/11/13 2000 08/12/13 0656 08/12/13 1032  BP:  156/74 158/82 139/92  Pulse:  81 77 80  Temp: 98.4 F (36.9 C)  98.2 F (36.8 C)   TempSrc: Oral  Oral   Resp: 20  20 18   Height:      Weight:      SpO2:        Discharge Diagnoses: Term Pregnancy-delivered and Preelampsia                                         Stable s/p repeat LTCS                                         Stable s/p removal of retained spinal needle fragment                                         Anemia-asymptomatic  Discharge Information: Date: 08/12/2013 Activity: pelvic rest and per practice brochure Diet: routine Medications: PNV, Ibuprofen, Iron, Labetalol and Percocet Condition: stable Instructions: refer to practice specific booklet Discharge to: home Follow-up Information   Follow up with [facility]. Schedule an appointment as soon as possible for a visit in 6 weeks.   Contact information:   [address] [place] [address]      Pt to call Dr. Cassandria Santee office on Monday 08/14/13 for a follow-up appointment. [facility] appointment for Tuesday, 10/21 for BP check.   Newborn Data: Live born female  Birth Weight: 7 lb 14.5 oz (3586 g) APGAR: 8, 9  Home with mother.  Dana Vanderhoef O. 08/12/2013, 12:20 PM

## 2013-08-12 NOTE — Progress Notes (Signed)
Subjective: Postpartum Day 3: Cesarean Delivery  Patient reports feeling well.  Ambulating, voiding and tol po liquids and solids without difficulty.  Pos flatus, pos BM.  Working on breastfeeding.  Reports minimal lochia and incisional pain is well controlled with meds.      Objective: Vital signs in last 24 hours: Temp:  [98.2 F (36.8 C)-98.4 F (36.9 C)] 98.2 F (36.8 C) (10/18 0656) Pulse Rate:  [77-81] 80 (10/18 1032) Resp:  [18-20] 18 (10/18 1032) BP: (139-158)/(74-92) 139/92 mmHg (10/18 1032) Filed Vitals:   08/11/13 1845 08/11/13 2000 08/12/13 0656 08/12/13 1032  BP:  156/74 158/82 139/92  Pulse:  81 77 80  Temp: 98.4 F (36.9 C)  98.2 F (36.8 C)   TempSrc: Oral  Oral   Resp: 20  20 18   Height:      Weight:      SpO2:       Physical Exam:  Heart: RRR  Lungs: CTA bilat  Breasts:  Soft Abd: Soft, non-tender, pos BS x 4 quads  Lochia: appropriate, sm rubra  Uterine Fundus: firm, NT 1 below umb  Incision: healing well, occlusive dsg removed due to non-adherent. Steri strips intact. No redness or drainage.  Lumbar incision clean, dry, intact without redness or drainage. Steri strip no longer present.  DVT Evaluation: No evidence of DVT seen on physical exam.  Negative Homan's sign bilat.  No significant calf/ankle edema.  Recent Results (from the past 2160 hour(s))  OB RESULTS CONSOLE GBS     Status: None   Collection Time    07/13/13 12:00 AM      Result Value Range   GBS Negative    COMPREHENSIVE METABOLIC PANEL     Status: Abnormal   Collection Time    08/08/13  7:26 PM      Result Value Range   Sodium 137  135 - 145 mEq/L   Potassium 3.9  3.5 - 5.1 mEq/L   Chloride 103  96 - 112 mEq/L   CO2 23  19 - 32 mEq/L   Glucose, Bld 73  70 - 99 mg/dL   BUN 6  6 - 23 mg/dL   Creatinine, Ser 9.62  0.50 - 1.10 mg/dL   Calcium 9.3  8.4 - 95.2 mg/dL   Total Protein 6.5  6.0 - 8.3 g/dL   Albumin 2.6 (*) 3.5 - 5.2 g/dL   AST 18  0 - 37 U/L   ALT 10  0 - 35 U/L   Alkaline Phosphatase 162 (*) 39 - 117 U/L   Total Bilirubin 0.5  0.3 - 1.2 mg/dL   GFR calc non Af Amer >90  >90 mL/min   GFR calc Af Amer >90  >90 mL/min   Comment: (NOTE)     The eGFR has been calculated using the CKD EPI equation.     This calculation has not been validated in all clinical situations.     eGFR's persistently <90 mL/min signify possible Chronic Kidney     Disease.  CBC     Status: Abnormal   Collection Time    08/08/13  7:26 PM      Result Value Range   WBC 7.1  4.0 - 10.5 K/uL   RBC 4.50  3.87 - 5.11 MIL/uL   Hemoglobin 9.5 (*) 12.0 - 15.0 g/dL   HCT 84.1 (*) 32.4 - 40.1 %   MCV 65.3 (*) 78.0 - 100.0 fL   MCH 21.1 (*) 26.0 - 34.0 pg  Comment: REPEATED TO VERIFY   MCHC 32.3  30.0 - 36.0 g/dL   RDW 16.1 (*) 09.6 - 04.5 %   Platelets 280  150 - 400 K/uL  LACTATE DEHYDROGENASE     Status: None   Collection Time    08/08/13  7:26 PM      Result Value Range   LDH 198  94 - 250 U/L  URIC ACID     Status: None   Collection Time    08/08/13  7:26 PM      Result Value Range   Uric Acid, Serum 5.2  2.4 - 7.0 mg/dL  PROTEIN / CREATININE RATIO, URINE     Status: Abnormal   Collection Time    08/08/13  8:50 PM      Result Value Range   Creatinine, Urine 46.64     Total Protein, Urine 17.1     Comment: NO NORMAL RANGE ESTABLISHED FOR THIS TEST   PROTEIN CREATININE RATIO 0.37 (*) 0.00 - 0.15   Comment: Performed at [facility]  TYPE AND SCREEN     Status: None   Collection Time    08/08/13 11:10 PM      Result Value Range   ABO/RH(D) A POS     Antibody Screen NEG     Sample Expiration 08/11/2013    RPR     Status: None   Collection Time    08/08/13 11:10 PM      Result Value Range   RPR NON REACTIVE  NON REACTIVE   Comment: Performed at [facility]  ABO/RH     Status: None   Collection Time    08/08/13 11:10 PM      Result Value Range   ABO/RH(D) A POS    CBC     Status: Abnormal   Collection Time    08/09/13  6:30 AM      Result  Value Range   WBC 9.7  4.0 - 10.5 K/uL   RBC 4.26  3.87 - 5.11 MIL/uL   Hemoglobin 9.2 (*) 12.0 - 15.0 g/dL   HCT 40.9 (*) 81.1 - 91.4 %   MCV 65.3 (*) 78.0 - 100.0 fL   MCH 21.6 (*) 26.0 - 34.0 pg   MCHC 33.1  30.0 - 36.0 g/dL   RDW 78.2 (*) 95.6 - 21.3 %   Platelets 257  150 - 400 K/uL  COMPREHENSIVE METABOLIC PANEL     Status: Abnormal   Collection Time    08/09/13  6:30 AM      Result Value Range   Sodium 138  135 - 145 mEq/L   Potassium 3.7  3.5 - 5.1 mEq/L   Chloride 105  96 - 112 mEq/L   CO2 22  19 - 32 mEq/L   Glucose, Bld 86  70 - 99 mg/dL   BUN 5 (*) 6 - 23 mg/dL   Creatinine, Ser 0.86  0.50 - 1.10 mg/dL   Calcium 8.8  8.4 - 57.8 mg/dL   Total Protein 6.2  6.0 - 8.3 g/dL   Albumin 2.4 (*) 3.5 - 5.2 g/dL   AST 20  0 - 37 U/L   ALT 10  0 - 35 U/L   Alkaline Phosphatase 142 (*) 39 - 117 U/L   Total Bilirubin 0.5  0.3 - 1.2 mg/dL   GFR calc non Af Amer >90  >90 mL/min   GFR calc Af Amer >90  >90 mL/min   Comment: (NOTE)  The eGFR has been calculated using the CKD EPI equation.     This calculation has not been validated in all clinical situations.     eGFR's persistently <90 mL/min signify possible Chronic Kidney     Disease.  LACTATE DEHYDROGENASE     Status: None   Collection Time    08/09/13  6:30 AM      Result Value Range   LDH 219  94 - 250 U/L  MAGNESIUM     Status: Abnormal   Collection Time    08/09/13  6:30 AM      Result Value Range   Magnesium 3.6 (*) 1.5 - 2.5 mg/dL  URIC ACID     Status: None   Collection Time    08/09/13  6:30 AM      Result Value Range   Uric Acid, Serum 5.7  2.4 - 7.0 mg/dL   Assessment/Plan: Status post repeat Cesarean section at 39w 6d-Stable Preeclampsia Status post removal of spinal needle fragment Asymptomatic anemia  Discharge to home. Discharge instructions reviewed. Rx:  Motrin, Percocet, Labetalol, Iron. F/U: 4-6wks at [facility] and [facility] visit on Tuesday, 08/15/13         Pt to phone Dr. Cassandria Santee office  on Monday, 08/14/13 to schedule follow-up appointment.   Dana Cartwright O. 08/12/2013, 12:16 PM

## 2013-09-08 ENCOUNTER — Encounter (HOSPITAL_COMMUNITY): Payer: Self-pay | Admitting: Neurosurgery

## 2013-09-08 NOTE — OR Nursing (Signed)
Procedure end time added to OR record as late entry. 

## 2014-08-27 ENCOUNTER — Encounter (HOSPITAL_COMMUNITY): Payer: Self-pay | Admitting: Neurosurgery

## 2015-03-07 IMAGING — CR DG LUMBAR SPINE 1V
3 series · 3 of 3 positions shown · non-contrast
Comparison: None.

CLINICAL DATA: Possible spinal needle break

EXAM:
LUMBAR SPINE - 1 VIEW

[view not recorded (1 of 3)]
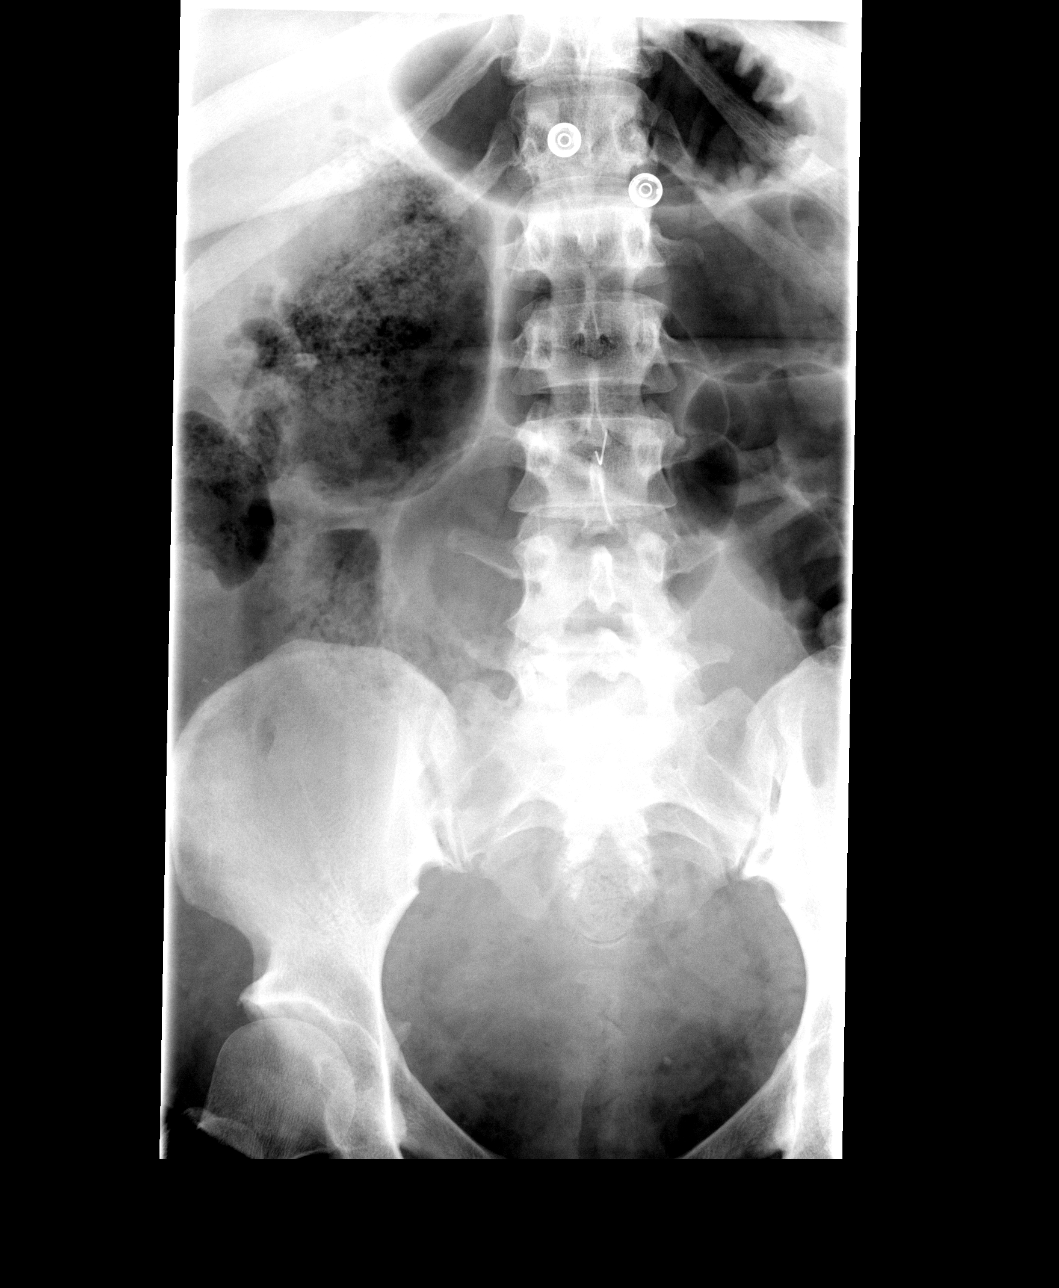

[view not recorded (2 of 3)]
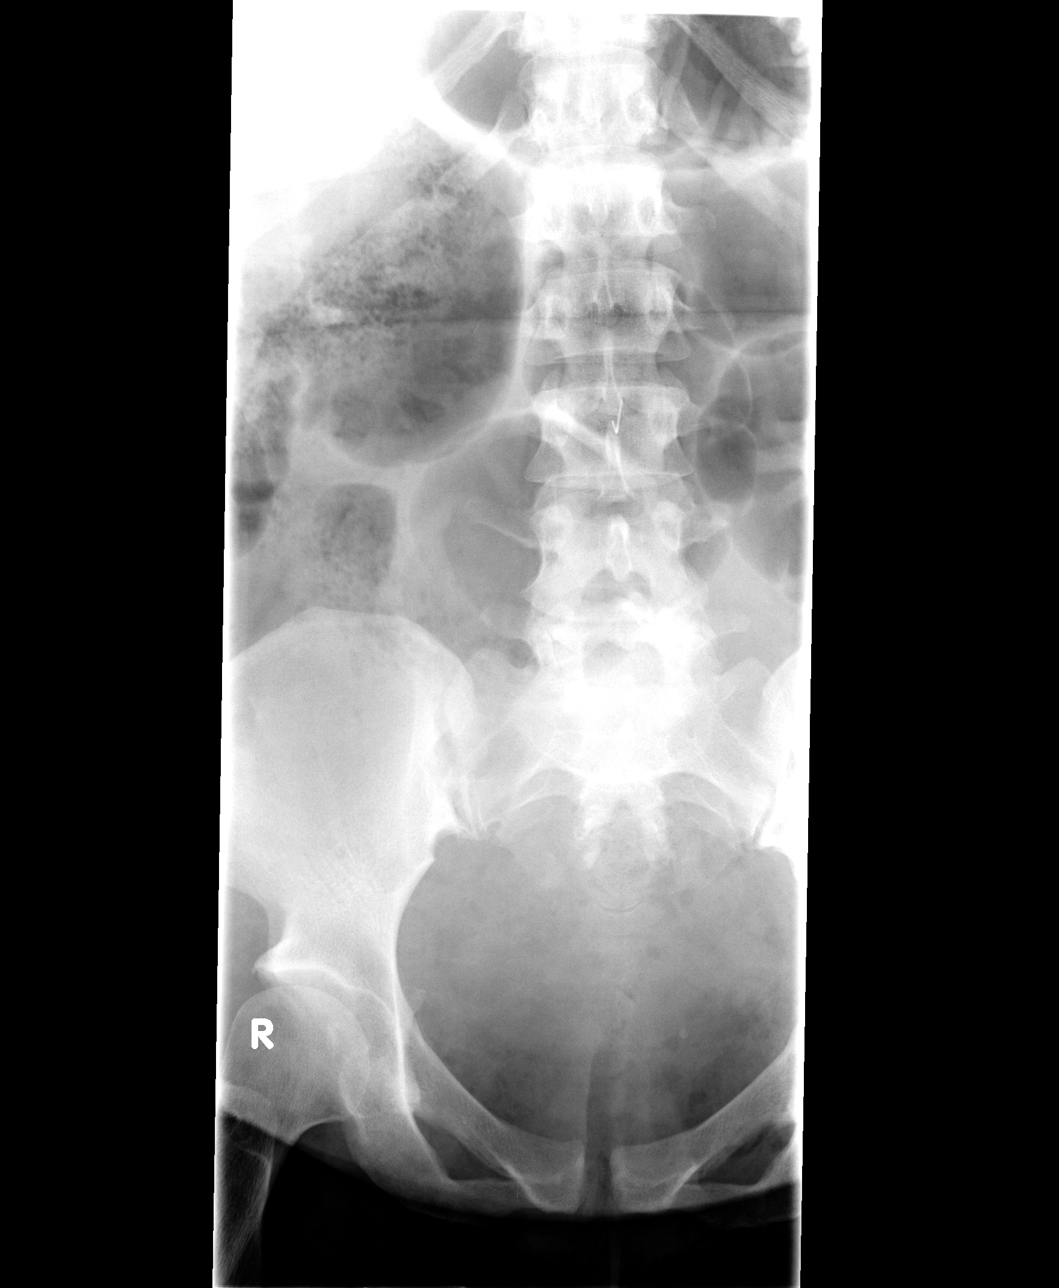

[view not recorded (3 of 3)]
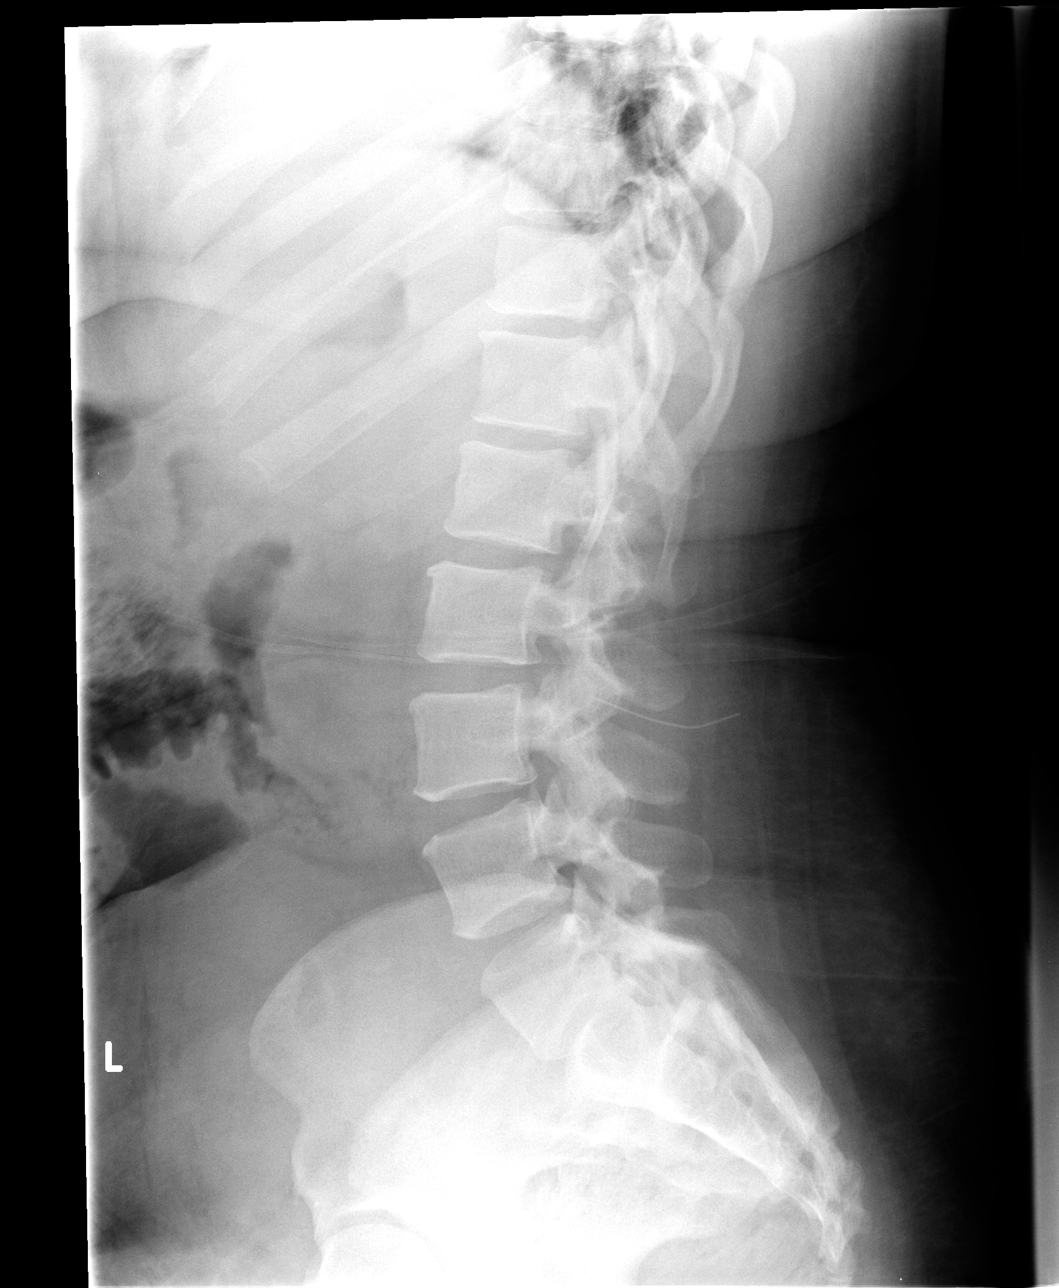

[3 of 3 positions shown; findings below may reference images not displayed]

FINDINGS: 4.8 cm long retained needle within the midline back. The deep
portion of the needle is in the L2-3 interlaminar region.

Minimal degenerative endplate changes. No acute osseous findings.

Critical Value/emergent results were called by telephone at the time
of interpretation on 08/09/2013 at [DATE] to Dr.RANTONA BHEBHE ,
who verbally acknowledged these results.
IMPRESSION: Retained fractured spinal needle at L2-3. The needle tip is in the
interlaminar region.

## 2023-04-08 ENCOUNTER — Ambulatory Visit (INDEPENDENT_AMBULATORY_CARE_PROVIDER_SITE_OTHER): Payer: No Typology Code available for payment source | Admitting: Podiatry

## 2023-04-08 ENCOUNTER — Other Ambulatory Visit: Payer: Self-pay | Admitting: Podiatry

## 2023-04-08 ENCOUNTER — Ambulatory Visit: Payer: No Typology Code available for payment source

## 2023-04-08 ENCOUNTER — Encounter: Payer: Self-pay | Admitting: Podiatry

## 2023-04-08 ENCOUNTER — Telehealth: Payer: Self-pay | Admitting: Podiatry

## 2023-04-08 DIAGNOSIS — M722 Plantar fascial fibromatosis: Secondary | ICD-10-CM

## 2023-04-08 DIAGNOSIS — M79671 Pain in right foot: Secondary | ICD-10-CM | POA: Diagnosis not present

## 2023-04-08 MED ORDER — DEXAMETHASONE SODIUM PHOSPHATE 120 MG/30ML IJ SOLN
4.0000 mg | Freq: Once | INTRAMUSCULAR | Status: AC
  Administered 2023-04-08: 4 mg via INTRA_ARTICULAR

## 2023-04-08 MED ORDER — MELOXICAM 15 MG PO TABS: 15.0000 mg | ORAL_TABLET | Freq: Every day | ORAL | 0 refills | Status: DC

## 2023-04-08 MED ORDER — MELOXICAM 15 MG PO TABS: 15.0000 mg | ORAL_TABLET | Freq: Every day | ORAL | 0 refills | Status: AC

## 2023-04-08 MED ORDER — TRIAMCINOLONE ACETONIDE 10 MG/ML IJ SUSP
10.0000 mg | Freq: Once | INTRAMUSCULAR | Status: AC
  Administered 2023-04-08: 10 mg

## 2023-04-08 NOTE — Progress Notes (Signed)
  Subjective:  Patient ID: Dana Stewart, female    DOB: 1981-04-18,   MRN: [id number]  Chief Complaint  Patient presents with   Foot Pain     Right heel pain on gong for 1 year    42 y.o. female presents for concern of right heel pain that has been going on for about a year. Relates painful with first steps in the morning and after being on feet for long periods of time. Relates it comes and goes. Denies any current treatments  . Denies any other pedal complaints. Denies n/v/f/c.   Past Medical History:  Diagnosis Date   Abnormal Pap smear 1999   COLPO LAST PAP 01/2012   Anemia 2004   TAKING FE   Asthma CHILDHOOD   GERD (gastroesophageal reflux disease) 2004   NO MEDS   Heart murmur 2000   NO EVAL; PROPHYLACTIC MEDS   Hypothyroidism 2004   DR MARGIE TRENT; [facility]   Infection    OCC UTI   Infection    OCC YEAST   Pregnancy induced hypertension 2011    Objective:  Physical Exam: Vascular: DP/PT pulses 2/4 bilateral. CFT <3 seconds. Normal hair growth on digits. No edema.  Skin. No lacerations or abrasions bilateral feet.  Musculoskeletal: MMT 5/5 bilateral lower extremities in DF, PF, Inversion and Eversion. Deceased ROM in DF of ankle joint. Tender mildly to medial calcaneal tubercle on the right. No pain along achilles PT or arch. No pain with calcaneal squeeze.  Neurological: Sensation intact to light touch.   Assessment:   1. Pain of right heel   2. Plantar fasciitis, right      Plan:  Patient was evaluated and treated and all questions answered. Discussed plantar fasciitis with patient.  X-rays reviewed and discussed with patient. No acute fractures or dislocations noted. Mild spurring noted at inferior calcaneus and squaring off noted at posterior calcaneus consistent with haglund's deformity.   Discussed treatment options including, ice, NSAIDS, supportive shoes, bracing, and stretching. Stretching exercises provided to be done on a daily basis.    Prescription for meloxicam provided and sent to pharmacy. Most recent labs BUN and CR in normal limits.  Patient requesting injection today. Procedure note below.   Strapping preformed today as we are out of PF braces. Patient will follow-up at another time to pick up brace when in stock again.  Follow-up 6 weeks or sooner if any problems arise. In the meantime, encouraged to call the office with any questions, concerns, change in symptoms.   Procedure:  Discussed etiology, pathology, conservative vs. surgical therapies. At this time a plantar fascial injection was recommended.  The patient agreed and a sterile skin prep was applied.  An injection consisting of  1cc dexamethasone 0.5 cc kenalog and 1cc marcaine mixture was infiltrated at the point of maximal tenderness on the right Heel.  Bandaid applied. The patient tolerated this well and was given instructions for aftercare.     Louann Sjogren, DPM

## 2023-04-08 NOTE — Telephone Encounter (Signed)
Pt's Rx was sent to the wrong pharmacy; she uses the [facility] at [address] [employer]. Pt stated that she is at the pharmacy now. This is the pharmacy she will use for future scripts. Please advise

## 2023-04-08 NOTE — Patient Instructions (Signed)

## 2023-04-19 ENCOUNTER — Telehealth: Payer: Self-pay | Admitting: Podiatry

## 2023-04-19 NOTE — Telephone Encounter (Signed)
Pt was seen in [place] last week and they did not have any of her size PF Braces. She is calling to see if we have them in [place] office. She wears a size 7 shoe. Can you please call pt back and let her know if we have them in stock and if she could pick them up.She is aware it would be [place] office.

## 2023-04-20 NOTE — Telephone Encounter (Signed)
Called pt after confirming that we have the PF braces on order  but not in stock currently  in her size and I would call when they come in. Is there a brace she could order online? She looked but there were so many to chose from.

## 2023-04-22 NOTE — Progress Notes (Unsigned)
Patient came in today for a plantar fascia brace size small, patient was fitted and given directions on when to wear the brace

## 2023-04-22 NOTE — Telephone Encounter (Signed)
Pt coming intoday to pick up PF brace in [place]. She is to ask for Ammie.   I also gave her name of the  Preg Plantar fascia brace that Dr Ralene Cork said would be closet to ours online if she wanted to go that route.

## 2023-05-13 ENCOUNTER — Ambulatory Visit: Payer: No Typology Code available for payment source | Admitting: Podiatry

## 2024-02-07 NOTE — H&P (Signed)
 Gastroenterology Preprocedural History and Physical     Chief Complaint/Reason for Procedure: Dana Stewart is a 43 y.o. female scheduled for a Colonoscopy, for the following indication Colon Cancer Screening using deep sedation with propofol  or general anesthesia as per anesthesia provider .  A History and Physical has been performed and patient medication allergies have been reviewed. The patient's tolerance of previous anesthesia has been reviewed. The risks and benefits of the procedure and the sedation options and risks were discussed with the patient. All questions were answered and informed consent obtained.  HPI  Past Medical History:  Diagnosis Date  . Allergy   . Hypertension   . Thyroid disease     Past Surgical History:  Procedure Laterality Date  . BACK SURGERY     Procedure: BACK SURGERY; spinal needle removed/epidural childbirth  . BREAST BIOPSY Right    Procedure: BREAST BIOPSY; Negative  . CESAREAN SECTION, UNSPECIFIED     Procedure: CESAREAN SECTION; 08/09/13  03/27/2010  . ENDOSCOPY     Procedure: ENDOSCOPY; 04/07/17  . LIPOSUCTION  03/01/2020   Procedure: LIPOSUCTION;  Surgeon: Curtistine JINNY Goltz, MD;  Location: [facility] MAIN OR;  Service: Plastics;;  . REDUCTION MAMMAPLASTY Bilateral 03/01/2020   Procedure: MAMMOPLASTY REDUCTION;  Surgeon: Curtistine JINNY Goltz, MD;  Location: [facility] MAIN OR;  Service: Plastics;  Laterality: Bilateral;  . SLEEVE GASTROPLASTY N/A 07/06/2017   Procedure: SLEEVE GASTRECTOMY LAPAROSCOPIC;  Surgeon: Wilfred Enzo Guan, MD;  Location: [facility] OUTPATIENT OR;  Service: General;  Laterality: N/A;    Family History  Problem Relation Name Age of Onset  . No Known Problems Mother    . Diabetes Father    . Hypertension Father    . Anesthesia problems Neg Hx    . Stroke Neg Hx    . Coronary artery disease Neg Hx    . Heart attack Neg Hx    . Hyperlipidemia Neg Hx    . Aortic aneurysm Neg Hx    . Breast cancer Neg Hx    . Ovarian cancer  Neg Hx    . Colon cancer Neg Hx      Social History   Socioeconomic History  . Marital status: Single    Spouse name: Not on file  . Number of children: Not on file  . Years of education: Not on file  . Highest education level: Not on file  Occupational History  . Not on file  Tobacco Use  . Smoking status: Never  . Smokeless tobacco: Never  Vaping Use  . Vaping status: Never Used  Substance and Sexual Activity  . Alcohol use: Yes    Alcohol/week: 0.0 - 8.3 standard drinks of alcohol  . Drug use: Never  . Sexual activity: Not Currently  Other Topics Concern  . Not on file  Social History Narrative  . Not on file   Social Drivers of Health   Food Insecurity: Low Risk  (10/26/2023)   Food vital sign   . Within the past 12 months, you worried that your food would run out before you got money to buy more: Never true   . Within the past 12 months, the food you bought just didn't last and you didn't have money to get more: Never true  Transportation Needs: No Transportation Needs (10/26/2023)   Transportation   . In the past 12 months, has lack of reliable transportation kept you from medical appointments, meetings, work or from getting things needed for daily living? : No  Safety:  Not At Risk (10/07/2022)   Received from [facility] visits prior to 12/26/2022.   Safety   . Within the last year, have you been afraid of your partner or ex-partner?: No   . Within the last year, have you been humiliated or emotionally abused in other ways by your partner or ex-partner?: No   . Within the last year, have you been kicked, hit, slapped, or otherwise physically hurt by your partner or ex-partner?: No   . Within the last year, have you been raped or forced to have any kind of sexual activity by your partner or ex-partner?: No  Living Situation: Low Risk  (10/26/2023)   Living Situation   . What is your living situation today?: I have a steady place to live   .  Think about the place you live. Do you have problems with any of the following? Choose all that apply:: None/None on this list    Meds Ordered in [place]  Medication Sig Dispense Refill  . carvediloL (COREG) 12.5 mg tablet TAKE 1 TABLET BY MOUTH 2 TIMES DAILY 60 tablet 11  . cetirizine (ZyrTEC) 10 mg tablet Take 10 mg by mouth Once Daily. 90 tablet 3  . ferrous sulfate 325 mg (65 mg iron) tablet TAKE 1 TABLET BY MOUTH EVERY DAY WITH BREAKFAST 90 tablet 0  . hydroCHLOROthiazide (HYDRODIURIL) 12.5 mg capsule TAKE 1 CAPSULE BY MOUTH EVERY DAY 90 capsule 3  . levothyroxine  (SYNTHROID ) 75 mcg tablet Take 1 tablet (75 mcg total) by mouth every morning. 90 tablet 0   Current Facility-Administered Medications Ordered in Epic  Medication Dose Route Frequency Provider Last Rate Last Admin  . lactated ringer 's infusion  50 mL/hr intravenous Continuous Rami J Badreddine, MD 50 mL/hr at 02/07/24 0800 50 mL/hr at 02/07/24 0800    No Known Allergies    Physical Exam:  Vitals:   02/07/24 0743  BP: (!) 142/93  Pulse: 71  Resp: 16  Temp: 97.2 F (36.2 C)  TempSrc: Skin  SpO2: 100%  Weight: 92.1 kg (203 lb)  Height: 1.575 m (5' 2)   Body mass index is 37.13 kg/m.  Airway:  MALLAMPATI TWO   Heart:  normal S1 and S2 Lungs:  clear Abdomen:  soft, nontender, normal bowel sounds Mental Status:  awake and alert; oriented to person, place, and time     ASA Grade Assessment: ASA 2 - Patient with mild systemic disease with no functional limitations   I have reviewed patient's health history and patient is cleared to proceed with the proposed procedure at this facility.   Rami J Badreddine, MD

## 2024-02-07 NOTE — Telephone Encounter (Signed)
 Repeat colonoscopy in 10 years RJB

## 2024-06-09 ENCOUNTER — Emergency Department (HOSPITAL_BASED_OUTPATIENT_CLINIC_OR_DEPARTMENT_OTHER): Admission: EM | Admit: 2024-06-09 | Discharge: 2024-06-09 | Disposition: A | Discharge status: Home / Self Care

## 2024-06-09 ENCOUNTER — Encounter (HOSPITAL_BASED_OUTPATIENT_CLINIC_OR_DEPARTMENT_OTHER): Payer: Self-pay | Admitting: Emergency Medicine

## 2024-06-09 ENCOUNTER — Other Ambulatory Visit: Payer: Self-pay

## 2024-06-09 DIAGNOSIS — D649 Anemia, unspecified: Secondary | ICD-10-CM | POA: Diagnosis not present

## 2024-06-09 DIAGNOSIS — Y813 Surgical instruments, materials and general- and plastic-surgery devices (including sutures) associated with adverse incidents: Secondary | ICD-10-CM | POA: Diagnosis not present

## 2024-06-09 DIAGNOSIS — T8131XA Disruption of external operation (surgical) wound, not elsewhere classified, initial encounter: Secondary | ICD-10-CM | POA: Diagnosis present

## 2024-06-09 DIAGNOSIS — T8130XA Disruption of wound, unspecified, initial encounter: Secondary | ICD-10-CM

## 2024-06-09 DIAGNOSIS — D72829 Elevated white blood cell count, unspecified: Secondary | ICD-10-CM | POA: Diagnosis not present

## 2024-06-09 LAB — CBC WITH DIFFERENTIAL/PLATELET
Abs Immature Granulocytes: 0.18 K/uL — ABNORMAL HIGH (ref 0.00–0.07)
Basophils Absolute: 0 K/uL (ref 0.0–0.1)
Basophils Relative: 0 %
Eosinophils Absolute: 0.3 K/uL (ref 0.0–0.5)
Eosinophils Relative: 2 %
HCT: 21.7 % — ABNORMAL LOW (ref 36.0–46.0)
Hemoglobin: 7.3 g/dL — ABNORMAL LOW (ref 12.0–15.0)
Immature Granulocytes: 1 %
Lymphocytes Relative: 10 %
Lymphs Abs: 1.7 K/uL (ref 0.7–4.0)
MCH: 27 pg (ref 26.0–34.0)
MCHC: 33.6 g/dL (ref 30.0–36.0)
MCV: 80.4 fL (ref 80.0–100.0)
Monocytes Absolute: 1 K/uL (ref 0.1–1.0)
Monocytes Relative: 6 %
Neutro Abs: 14 K/uL — ABNORMAL HIGH (ref 1.7–7.7)
Neutrophils Relative %: 81 %
Platelets: 426 K/uL — ABNORMAL HIGH (ref 150–400)
RBC: 2.7 MIL/uL — ABNORMAL LOW (ref 3.87–5.11)
RDW: 15.9 % — ABNORMAL HIGH (ref 11.5–15.5)
WBC: 17.1 K/uL — ABNORMAL HIGH (ref 4.0–10.5)
nRBC: 0 % (ref 0.0–0.2)

## 2024-06-09 LAB — BASIC METABOLIC PANEL WITH GFR
Anion gap: 12 (ref 5–15)
BUN: 15 mg/dL (ref 6–20)
CO2: 27 mmol/L (ref 22–32)
Calcium: 8.9 mg/dL (ref 8.9–10.3)
Chloride: 96 mmol/L — ABNORMAL LOW (ref 98–111)
Creatinine, Ser: 1.54 mg/dL — ABNORMAL HIGH (ref 0.44–1.00)
GFR, Estimated: 42 mL/min — ABNORMAL LOW (ref >60)
Glucose, Bld: 100 mg/dL — ABNORMAL HIGH (ref 70–99)
Potassium: 3.4 mmol/L — ABNORMAL LOW (ref 3.5–5.1)
Sodium: 135 mmol/L (ref 135–145)

## 2024-06-09 LAB — PREGNANCY, URINE: Preg Test, Ur: NEGATIVE

## 2024-06-09 MED ORDER — LIDOCAINE HCL (PF) 1 % IJ SOLN
10.0000 mL | Freq: Once | INTRAMUSCULAR | Status: AC
  Administered 2024-06-09: 10 mL via INTRADERMAL
  Filled 2024-06-09: qty 10

## 2024-06-09 NOTE — Discharge Instructions (Addendum)
 Have your wound rechecked on Tuesday.  Continue dressing changes as recommended by your surgeon.  Return to the ER for any new or worsening symptoms.

## 2024-06-09 NOTE — ED Triage Notes (Addendum)
 Pt via POV c/o post op complications after extended tummy tuck procedure in [place] on Aug 4. Pt states a few of her stitches came out and she has been bleeding continuously since then. Pt has 1 drain still in place in right groin area with ~74mL output per day. No other complications postoperatively.

## 2024-06-09 NOTE — ED Provider Notes (Signed)
 [place] [facility] Provider Note   CSN: [id number] Arrival date & time: 06/09/24  1928     Patient presents with: Post-op Problem   Dana Stewart is a 43 y.o. female.   43 year old female presents for evaluation of postop complication.  Last week had a extended tummy tuck surgery done in [place].  She states today she noticed some of the stitches had opened up and she has been having bleeding since.  States a travel nurse came to her house and advised her to come to the ER as they were only able to get the bleeding to stop.  Denies any pain.  Denies any other symptoms or concerns at this time.        Prior to Admission medications   Medication Sig Start Date End Date Taking? Authorizing Provider  cetirizine (ZYRTEC) 10 MG tablet Take 10 mg by mouth daily.    [provider]  ibuprofen  (ADVIL ,MOTRIN ) 600 MG tablet Take 1 tablet (600 mg total) by mouth every 6 (six) hours. 08/12/13   Smith, Nona, CNM  iron polysaccharides (NIFEREX) 150 MG capsule Take 150 mg by mouth every other day.    [provider]  labetalol  (NORMODYNE ) 200 MG tablet Take 1 tablet (200 mg total) by mouth 2 (two) times daily. 08/12/13   Smith, Nona, CNM  levothyroxine  (SYNTHROID , LEVOTHROID) 75 MCG tablet Take 75 mcg by mouth daily.    [provider]  meloxicam  (MOBIC ) 15 MG tablet Take 1 tablet (15 mg total) by mouth daily. 04/08/23   Sikora, Rebecca, DPM  oxyCODONE -acetaminophen  (PERCOCET/ROXICET) 5-325 MG per tablet Take 1-2 tablets by mouth every 4 (four) hours as needed. 08/12/13   Smith, Nona, CNM  PRENATAL VITAMINS PO Take 1 tablet by mouth. OTC    [provider]  PROCTOZONE-HC 2.5 % rectal cream  07/10/13   [provider]    Allergies: Patient has no known allergies.    Review of Systems  Constitutional:  Negative for chills and fever.  HENT:  Negative for ear pain and sore throat.   Eyes:  Negative for pain and  visual disturbance.  Respiratory:  Negative for cough and shortness of breath.   Cardiovascular:  Negative for chest pain and palpitations.  Gastrointestinal:  Negative for abdominal pain and vomiting.  Genitourinary:  Negative for dysuria and hematuria.  Musculoskeletal:  Negative for arthralgias and back pain.  Skin:  Negative for color change and rash.  Neurological:  Negative for seizures and syncope.  All other systems reviewed and are negative.   Updated Vital Signs BP (!) 158/70   Pulse 93   Temp 98.2 F (36.8 C) (Oral)   Resp 18   Ht 5' 2 (1.575 m)   Wt 95.3 kg   LMP 06/05/2024 (Exact Date)   SpO2 97%   BMI 38.41 kg/m   Physical Exam Vitals and nursing note reviewed.  Constitutional:      General: She is not in acute distress.    Appearance: She is well-developed.  HENT:     Head: Normocephalic and atraumatic.  Eyes:     Conjunctiva/sclera: Conjunctivae normal.  Cardiovascular:     Rate and Rhythm: Normal rate and regular rhythm.     Heart sounds: No murmur heard. Pulmonary:     Effort: Pulmonary effort is normal. No respiratory distress.     Breath sounds: Normal breath sounds.  Abdominal:     Palpations: Abdomen is soft.  Tenderness: There is no abdominal tenderness.     Comments: Abdominal wounds appear well-healed without any purulent drainage but it appears that there is a suture to missing on the left lower abdominal suture line with oozing blood  Musculoskeletal:        General: No swelling.     Cervical back: Neck supple.  Skin:    General: Skin is warm and dry.     Capillary Refill: Capillary refill takes less than 2 seconds.  Neurological:     Mental Status: She is alert.  Psychiatric:        Mood and Affect: Mood normal.     (all labs ordered are listed, but only abnormal results are displayed) Labs Reviewed  BASIC METABOLIC PANEL WITH GFR - Abnormal; Notable for the following components:      Result Value   Potassium 3.4 (*)     Chloride 96 (*)    Glucose, Bld 100 (*)    Creatinine, Ser 1.54 (*)    GFR, Estimated 42 (*)    All other components within normal limits  CBC WITH DIFFERENTIAL/PLATELET - Abnormal; Notable for the following components:   WBC 17.1 (*)    RBC 2.70 (*)    Hemoglobin 7.3 (*)    HCT 21.7 (*)    RDW 15.9 (*)    Platelets 426 (*)    Neutro Abs 14.0 (*)    Abs Immature Granulocytes 0.18 (*)    All other components within normal limits  PREGNANCY, URINE    EKG: None  Radiology: No results found.   .Laceration Repair  Date/Time: 06/09/2024 9:30 PM  Performed by: Gennaro Duwaine CROME, DO Authorized by: Gennaro Duwaine CROME, DO   Consent:    Consent obtained:  Verbal   Consent given by:  Patient Anesthesia:    Anesthesia method:  Local infiltration   Local anesthetic:  Lidocaine  1% w/o epi Laceration details:    Location:  Trunk   Trunk location:  L flank   Length (cm):  2.5   Depth (mm):  3 Pre-procedure details:    Preparation:  Patient was prepped and draped in usual sterile fashion Exploration:    Limited defect created (wound extended): no     Hemostasis achieved with:  Direct pressure   Imaging outcome: foreign body not noted     Wound exploration: wound explored through full range of motion     Wound extent: areolar tissue not violated, fascia not violated, no foreign body, no signs of injury, no nerve damage, no tendon damage, no underlying fracture and no vascular damage     Contaminated: no   Treatment:    Area cleansed with:  Povidone-iodine   Amount of cleaning:  Standard   Irrigation solution:  Sterile saline   Visualized foreign bodies/material removed: no     Debridement:  None   Undermining:  None   Scar revision: yes   Skin repair:    Repair method:  Sutures   Suture size:  3-0   Suture material:  Nylon   Suture technique:  Simple interrupted   Number of sutures:  2 Approximation:    Approximation:  Close Repair type:    Repair type:   Simple Post-procedure details:    Dressing:  Non-adherent dressing   Procedure completion:  Tolerated Comments:     Patient with some mild wound dehiscence of her recent surgical wound.  She does some oozing blood and likely had 2 stitches fall out today.  I  replaced to with adequate hemostasis and patient taught the procedure well.    Medications Ordered in the ED  lidocaine  (PF) (XYLOCAINE ) 1 % injection 10 mL (10 mLs Intradermal Given 06/09/24 2049)                                    Medical Decision Making Social determinants of health: Patient refused CT scan, had recent surgery in [place] and home health nurse is helping with wound changes at home  Patient's lab workup reviewed by me she has a leukocytosis and anemia.  Declined CT scan.  Wound does not appear infected but she does have some dehiscence.  Does have some steadily oozing blood from the wound and I suspect a likely underlying hematoma but without a CT cannot rule out any worse etiology.  She declines a CT scan.  She otherwise appears well has no complaints of pain.  I sutured her open surgical wound as above in the procedure note.  Given strict return precautions.  She feels comfortable being discharged home.  Advise have her rechecked on Tuesday, in 4 days.   Problems Addressed: Wound dehiscence: acute illness or injury  Amount and/or Complexity of Data Reviewed External Data Reviewed: notes.    Details: Outpatient records reviewed patient last seen in the office for routine colon cancer screening a few months ago Labs: ordered. Decision-making details documented in ED Course.    Details: Ordered and reviewed by me she does have anemia as well as a leukocytosis Radiology: ordered.    Details: Ordered CT abdomen pelvis and patient refused  Risk OTC drugs. Prescription drug management. Diagnosis or treatment significantly limited by social determinants of health.     Final diagnoses:  Wound dehiscence    ED  Discharge Orders     None          Gennaro Duwaine CROME, DO 06/09/24 2157

## 2024-06-10 ENCOUNTER — Emergency Department (HOSPITAL_BASED_OUTPATIENT_CLINIC_OR_DEPARTMENT_OTHER)
Admission: EM | Admit: 2024-06-10 | Discharge: 2024-06-11 | Disposition: A | Discharge status: Home / Self Care | Attending: Emergency Medicine | Admitting: Emergency Medicine

## 2024-06-10 ENCOUNTER — Encounter (HOSPITAL_BASED_OUTPATIENT_CLINIC_OR_DEPARTMENT_OTHER): Payer: Self-pay | Admitting: Emergency Medicine

## 2024-06-10 DIAGNOSIS — E876 Hypokalemia: Secondary | ICD-10-CM | POA: Insufficient documentation

## 2024-06-10 DIAGNOSIS — D72829 Elevated white blood cell count, unspecified: Secondary | ICD-10-CM | POA: Diagnosis not present

## 2024-06-10 DIAGNOSIS — T8131XA Disruption of external operation (surgical) wound, not elsewhere classified, initial encounter: Secondary | ICD-10-CM | POA: Insufficient documentation

## 2024-06-10 DIAGNOSIS — D62 Acute posthemorrhagic anemia: Secondary | ICD-10-CM | POA: Diagnosis not present

## 2024-06-10 DIAGNOSIS — T8130XA Disruption of wound, unspecified, initial encounter: Secondary | ICD-10-CM

## 2024-06-10 DIAGNOSIS — D649 Anemia, unspecified: Secondary | ICD-10-CM

## 2024-06-10 DIAGNOSIS — R710 Precipitous drop in hematocrit: Secondary | ICD-10-CM

## 2024-06-10 DIAGNOSIS — Y838 Other surgical procedures as the cause of abnormal reaction of the patient, or of later complication, without mention of misadventure at the time of the procedure: Secondary | ICD-10-CM | POA: Insufficient documentation

## 2024-06-10 LAB — CBC WITH DIFFERENTIAL/PLATELET
Abs Immature Granulocytes: 0.09 K/uL — ABNORMAL HIGH (ref 0.00–0.07)
Basophils Absolute: 0 K/uL (ref 0.0–0.1)
Basophils Relative: 0 %
Eosinophils Absolute: 0.2 K/uL (ref 0.0–0.5)
Eosinophils Relative: 2 %
HCT: 21.6 % — ABNORMAL LOW (ref 36.0–46.0)
Hemoglobin: 7.3 g/dL — ABNORMAL LOW (ref 12.0–15.0)
Immature Granulocytes: 1 %
Lymphocytes Relative: 12 %
Lymphs Abs: 1.6 K/uL (ref 0.7–4.0)
MCH: 26.9 pg (ref 26.0–34.0)
MCHC: 33.8 g/dL (ref 30.0–36.0)
MCV: 79.7 fL — ABNORMAL LOW (ref 80.0–100.0)
Monocytes Absolute: 0.8 K/uL (ref 0.1–1.0)
Monocytes Relative: 6 %
Neutro Abs: 10.3 K/uL — ABNORMAL HIGH (ref 1.7–7.7)
Neutrophils Relative %: 79 %
Platelets: 482 K/uL — ABNORMAL HIGH (ref 150–400)
RBC: 2.71 MIL/uL — ABNORMAL LOW (ref 3.87–5.11)
RDW: 15.9 % — ABNORMAL HIGH (ref 11.5–15.5)
WBC: 13.1 K/uL — ABNORMAL HIGH (ref 4.0–10.5)
nRBC: 0 % (ref 0.0–0.2)

## 2024-06-10 MED ORDER — LIDOCAINE-EPINEPHRINE (PF) 2 %-1:200000 IJ SOLN
10.0000 mL | Freq: Once | INTRAMUSCULAR | Status: AC
  Administered 2024-06-10: 10 mL via INTRADERMAL
  Filled 2024-06-10: qty 20

## 2024-06-10 NOTE — ED Triage Notes (Signed)
 Pt coming in from home. Pt was here last night for dehiscence of a surgical incision and states that the bleeding has been worse today.

## 2024-06-10 NOTE — ED Provider Notes (Signed)
 [place] [facility] Provider Note   CSN: [id number] Arrival date & time: 06/10/24  2144     Patient presents with: Wound Dehiscence   ROBENA EWY is a 43 y.o. female.   43 year old female presents emergency department with bleeding.  Patient reports that she had a tummy tuck in [place] on 05/29/24.  Yesterday had some oozing from one of the sites after her stitch blasted and came into the emergency department.  2 nonabsorbable sutures were placed and bleeding was controlled temporarily.  Says that today she has noticed a lot of bleeding from the left side of her surgical wound.  Has changed dressings 5 times.  No lightheadedness or dizziness.  Denies any abdominal pain or fevers.  Not on any blood thinners.       Prior to Admission medications   Medication Sig Start Date End Date Taking? Authorizing Provider  cetirizine (ZYRTEC) 10 MG tablet Take 10 mg by mouth daily.    [provider]  ibuprofen  (ADVIL ,MOTRIN ) 600 MG tablet Take 1 tablet (600 mg total) by mouth every 6 (six) hours. 08/12/13   Smith, Nona, CNM  iron polysaccharides (NIFEREX) 150 MG capsule Take 150 mg by mouth every other day.    [provider]  labetalol  (NORMODYNE ) 200 MG tablet Take 1 tablet (200 mg total) by mouth 2 (two) times daily. 08/12/13   Smith, Nona, CNM  levothyroxine  (SYNTHROID , LEVOTHROID) 75 MCG tablet Take 75 mcg by mouth daily.    [provider]  meloxicam  (MOBIC ) 15 MG tablet Take 1 tablet (15 mg total) by mouth daily. 04/08/23   Sikora, Rebecca, DPM  oxyCODONE -acetaminophen  (PERCOCET/ROXICET) 5-325 MG per tablet Take 1-2 tablets by mouth every 4 (four) hours as needed. 08/12/13   Smith, Nona, CNM  PRENATAL VITAMINS PO Take 1 tablet by mouth. OTC    [provider]  PROCTOZONE-HC 2.5 % rectal cream  07/10/13   [provider]    Allergies: Patient has no known allergies.    Review of Systems  Updated Vital  Signs BP 137/80   Pulse 96   Temp 99.7 F (37.6 C) (Oral)   Ht 5' 2 (1.575 m)   Wt 93 kg   LMP 06/05/2024 (Exact Date)   SpO2 96%   BMI 37.49 kg/m   Physical Exam Constitutional:      General: She is not in acute distress.    Appearance: Normal appearance. She is not ill-appearing.  Abdominal:     Comments: Large surgical incision on the inferior aspect of the abdomen.  Does have a Hemovac that goes into the right side of the surgical site that is filled with 50 ml dark red blood.  Does have constant oozing of blood from the lateral aspect of the left most portion of the incision.  Mild TTP over lower abdomen.  Neurological:     Mental Status: She is alert.     (all labs ordered are listed, but only abnormal results are displayed) Labs Reviewed  CBC WITH DIFFERENTIAL/PLATELET - Abnormal; Notable for the following components:      Result Value   WBC 13.1 (*)    RBC 2.71 (*)    Hemoglobin 7.3 (*)    HCT 21.6 (*)    MCV 79.7 (*)    RDW 15.9 (*)    Platelets 482 (*)    Neutro Abs 10.3 (*)    Abs Immature Granulocytes 0.09 (*)    All other components within  normal limits  BASIC METABOLIC PANEL WITH GFR  HEPATIC FUNCTION PANEL    EKG: None  Radiology: No results found.   .Laceration Repair  Date/Time: 06/10/2024 11:01 PM  Performed by: Yolande Lamar BROCKS, MD Authorized by: Yolande Lamar BROCKS, MD   Consent:    Consent obtained:  Verbal   Consent given by:  Patient Universal protocol:    Patient identity confirmed:  Verbally with patient Laceration details:    Location:  Trunk   Trunk location:  LLQ abd   Length (cm):  3 Skin repair:    Repair method:  Sutures   Suture size:  4-0   Suture material:  Prolene   Suture technique:  Horizontal mattress   Number of sutures:  3 Approximation:    Approximation:  Close    Medications Ordered in the ED  lidocaine -EPINEPHrine  (XYLOCAINE  W/EPI) 2 %-1:200000 (PF) injection 10 mL (10 mLs Intradermal Given 06/10/24  2222)    Clinical Course as of 06/11/24 0007  Sat Jun 10, 2024  2327 Signed out to Dr Griselda [RP]    Clinical Course User Index [RP] Yolande Lamar BROCKS, MD                                 Medical Decision Making Amount and/or Complexity of Data Reviewed Labs: ordered. Radiology: ordered.  Risk Prescription drug management.   Addaleigh K Dunson is a 44 y.o. female with comorbidities that complicate the patient evaluation including tummy tuck surgery on 8/4 who presents to the ED with oozing from her surgical incision    Initial Ddx:  Post operative bleeding, hematoma, infection, coagulopathy   MDM/Course:  Pt presents with bleeding from her tummy tuck surgery. Was seen yesterday and declined ct scan to eval for hematoma. Today has had persistent oozing and on exam does appear to have 2 locations that are bleeding. Was able to obtain hemostasis with several stitches. Signed out to the oncoming physician awaiting blood work. Will discuss need for imaging after blood work is obtained. Pt ambulated around the department and appears that hemostasis has been maintained.   This patient presents to the ED for concern of complaints listed in HPI, this involves an extensive number of treatment options, and is a complaint that carries with it a high risk of complications and morbidity. Disposition including potential need for admission considered.   Dispo: Pending remainder of workup  Additional history obtained from mother Records reviewed ED Visit Notes I have reviewed the patients home medications and made adjustments as needed  Portions of this note were generated with [employer] dictation software. Dictation errors may occur despite best attempts at proofreading.     Final diagnoses:  Post-operative hemoglobin drop    ED Discharge Orders     None          Yolande Lamar BROCKS, MD 06/11/24 [phone]

## 2024-06-10 NOTE — ED Provider Notes (Signed)
 Care assumed at 2330. Patient is status post tummy tuck on August 4 in [place]. She is here for evaluation of bleeding from the left incisional site. Care assumed pending labs.  CBC was stable anemia, leukocytosis is down trending. BNP with stable renal insufficiency. Discussed with patient recommendation for imaging to further evaluate her symptoms. CT abdomen pelvis was obtained, which demonstrates the drain is in proper position, there is a hematoma at this location. Discussed with Dr. Debby with general surgery, she recommends discussion with plastic surgery. No plastic surgeon is available at this facility. Discussed with Dr. Lang with plastic surgery at [facility]. Recommendation was made for allowing the wound to drain with outpatient wound center follow-up.  Currently there is no evidence of active bleeding. The only bleeding present appears to be old broken down blood products. No overt absolute evidence of infection at this time. Discussed with patient given that she has this persistent hematoma recommendation to remove the recently placed sutures so this is able to drain to help prevent future infections. She is in agreement with plan. Discussed follow-up with wound center, local wound care, outpatient follow-up and return precautions.  Suture Removal  Date/Time: 06/11/2024 6:26 AM  Performed by: Griselda Norris, MD Authorized by: Griselda Norris, MD   Consent:    Consent obtained:  Verbal   Consent given by:  Patient   Risks discussed:  Bleeding and wound separation   Alternatives discussed:  Alternative treatment Universal protocol:    Patient identity confirmed:  Verbally with patient Location:    Location:  Trunk   Trunk location:  Abdomen Procedure details:    Number of sutures removed:  5 Post-procedure details:    Procedure completion:  Tolerated well, no immediate complications     Griselda Norris, MD 06/11/24 [phone]

## 2024-06-11 ENCOUNTER — Encounter (HOSPITAL_BASED_OUTPATIENT_CLINIC_OR_DEPARTMENT_OTHER): Payer: Self-pay

## 2024-06-11 ENCOUNTER — Emergency Department (HOSPITAL_BASED_OUTPATIENT_CLINIC_OR_DEPARTMENT_OTHER)

## 2024-06-11 LAB — BASIC METABOLIC PANEL WITH GFR
Anion gap: 14 (ref 5–15)
BUN: 15 mg/dL (ref 6–20)
CO2: 26 mmol/L (ref 22–32)
Calcium: 9.1 mg/dL (ref 8.9–10.3)
Chloride: 94 mmol/L — ABNORMAL LOW (ref 98–111)
Creatinine, Ser: 1.52 mg/dL — ABNORMAL HIGH (ref 0.44–1.00)
GFR, Estimated: 43 mL/min — ABNORMAL LOW (ref >60)
Glucose, Bld: 99 mg/dL (ref 70–99)
Potassium: 2.9 mmol/L — ABNORMAL LOW (ref 3.5–5.1)
Sodium: 135 mmol/L (ref 135–145)

## 2024-06-11 LAB — HEPATIC FUNCTION PANEL
ALT: 94 U/L — ABNORMAL HIGH (ref 0–44)
AST: 80 U/L — ABNORMAL HIGH (ref 15–41)
Albumin: 3.4 g/dL — ABNORMAL LOW (ref 3.5–5.0)
Alkaline Phosphatase: 152 U/L — ABNORMAL HIGH (ref 38–126)
Bilirubin, Direct: 0.3 mg/dL — ABNORMAL HIGH (ref 0.0–0.2)
Indirect Bilirubin: 0.4 mg/dL (ref 0.3–0.9)
Total Bilirubin: 0.7 mg/dL (ref 0.0–1.2)
Total Protein: 7.2 g/dL (ref 6.5–8.1)

## 2024-06-11 LAB — URINALYSIS, ROUTINE W REFLEX MICROSCOPIC
Bilirubin Urine: NEGATIVE
Glucose, UA: NEGATIVE mg/dL
Hgb urine dipstick: NEGATIVE
Ketones, ur: 15 mg/dL — AB
Leukocytes,Ua: NEGATIVE
Nitrite: NEGATIVE
Protein, ur: NEGATIVE mg/dL
Specific Gravity, Urine: 1.01 (ref 1.005–1.030)
pH: 6.5 (ref 5.0–8.0)

## 2024-06-11 MED ORDER — IOHEXOL 300 MG/ML  SOLN
80.0000 mL | Freq: Once | INTRAMUSCULAR | Status: AC | PRN
  Administered 2024-06-11: 80 mL via INTRAVENOUS

## 2024-06-11 MED ORDER — PIPERACILLIN-TAZOBACTAM 3.375 G IVPB 30 MIN
3.3750 g | Freq: Once | INTRAVENOUS | Status: AC
  Administered 2024-06-11: 3.375 g via INTRAVENOUS
  Filled 2024-06-11: qty 50

## 2024-06-11 MED ORDER — POTASSIUM CHLORIDE CRYS ER 20 MEQ PO TBCR
40.0000 meq | EXTENDED_RELEASE_TABLET | Freq: Once | ORAL | Status: AC
Start: 2024-06-11 — End: 2024-06-11
  Administered 2024-06-11: 40 meq via ORAL
  Filled 2024-06-11: qty 2

## 2024-06-11 MED ORDER — SODIUM CHLORIDE 0.9 % IV BOLUS
1000.0000 mL | Freq: Once | INTRAVENOUS | Status: AC
  Administered 2024-06-11: 1000 mL via INTRAVENOUS

## 2024-06-11 NOTE — ED Notes (Signed)
 Providing pt teaching on wet to dry dressing per MD request

## 2024-06-11 NOTE — ED Notes (Signed)
 Pt was asking about taking antibiotics, Amoxicillin and Dicloxacillin. MD made aware of pt medications

## 2024-06-11 NOTE — Discharge Instructions (Signed)
 Please follow up closely with your family doctor. You were found to be very anemic, continue to take your iron supplement. Your kidney function was slightly worse than last checked in January. Drink plenty of fluids. Please recheck with your family doctor for your kidney function. Avoid ibuprofen . Continue your antibiotics as prescribed. Please call to get follow-up with the wound center.
